# Patient Record
Sex: Male | Born: 1970 | Race: White | Hispanic: No | Marital: Married | Smoking: Former smoker
Health system: Southern US, Community
[De-identification: ages and names within clinical notes are randomized; demographics above are authoritative.]

## PROBLEM LIST (undated history)

## (undated) DIAGNOSIS — F419 Anxiety disorder, unspecified: Secondary | ICD-10-CM

## (undated) DIAGNOSIS — K219 Gastro-esophageal reflux disease without esophagitis: Secondary | ICD-10-CM

## (undated) DIAGNOSIS — E119 Type 2 diabetes mellitus without complications: Secondary | ICD-10-CM

## (undated) DIAGNOSIS — I10 Essential (primary) hypertension: Secondary | ICD-10-CM

## (undated) HISTORY — PX: BACK SURGERY: SHX140

## (undated) HISTORY — PX: HIP SURGERY: SHX245

---

## 2001-03-19 ENCOUNTER — Encounter: Payer: Self-pay | Admitting: Emergency Medicine

## 2001-03-19 ENCOUNTER — Emergency Department (HOSPITAL_COMMUNITY): Admission: EM | Admit: 2001-03-19 | Discharge: 2001-03-19 | Payer: Self-pay | Admitting: Emergency Medicine

## 2002-09-16 ENCOUNTER — Emergency Department (HOSPITAL_COMMUNITY): Admission: EM | Admit: 2002-09-16 | Discharge: 2002-09-16 | Payer: Self-pay | Admitting: Emergency Medicine

## 2003-09-20 ENCOUNTER — Emergency Department (HOSPITAL_COMMUNITY): Admission: EM | Admit: 2003-09-20 | Discharge: 2003-09-21 | Payer: Self-pay | Admitting: *Deleted

## 2007-01-05 ENCOUNTER — Ambulatory Visit (HOSPITAL_COMMUNITY): Admission: RE | Admit: 2007-01-05 | Discharge: 2007-01-05 | Payer: Self-pay | Admitting: Neurosurgery

## 2008-01-09 ENCOUNTER — Emergency Department (HOSPITAL_COMMUNITY): Admission: EM | Admit: 2008-01-09 | Discharge: 2008-01-09 | Payer: Self-pay | Admitting: Emergency Medicine

## 2008-03-20 ENCOUNTER — Emergency Department (HOSPITAL_COMMUNITY): Admission: EM | Admit: 2008-03-20 | Discharge: 2008-03-20 | Payer: Self-pay | Admitting: Emergency Medicine

## 2009-06-04 ENCOUNTER — Emergency Department (HOSPITAL_COMMUNITY): Admission: EM | Admit: 2009-06-04 | Discharge: 2009-06-04 | Payer: Self-pay | Admitting: Emergency Medicine

## 2010-06-11 LAB — BASIC METABOLIC PANEL
BUN: 6 mg/dL (ref 6–23)
CO2: 21 mEq/L (ref 19–32)
Calcium: 9.1 mg/dL (ref 8.4–10.5)
Chloride: 108 mEq/L (ref 96–112)
Creatinine, Ser: 0.82 mg/dL (ref 0.4–1.5)
GFR calc Af Amer: >60 mL/min (ref >60)
Potassium: 3.8 mEq/L (ref 3.5–5.1)
Sodium: 139 mEq/L (ref 135–145)

## 2010-06-11 LAB — CBC
Hemoglobin: 17.9 g/dL — ABNORMAL HIGH (ref 13.0–17.0)
MCHC: 34.3 g/dL (ref 30.0–36.0)
MCV: 98.2 fL (ref 78.0–100.0)
Platelets: 155 10*3/uL (ref 150–400)
WBC: 8.9 10*3/uL (ref 4.0–10.5)

## 2010-06-11 LAB — DIFFERENTIAL
Eosinophils Absolute: 0.2 10*3/uL (ref 0.0–0.7)
Eosinophils Relative: 2 % (ref 0–5)
Monocytes Relative: 5 % (ref 3–12)

## 2010-06-11 LAB — POCT CARDIAC MARKERS: Troponin i, poc: <0.05 ng/mL (ref 0.00–0.09)

## 2010-11-27 LAB — COMPREHENSIVE METABOLIC PANEL
AST: 34
Albumin: 4.4
BUN: 5 — ABNORMAL LOW
CO2: 25
Calcium: 9.2
Creatinine, Ser: 0.88
Glucose, Bld: 115 — ABNORMAL HIGH

## 2010-11-27 LAB — CBC
MCHC: 35.2
MCV: 98.5
Platelets: 161
RBC: 5.07
RDW: 13.2
WBC: 11.9 — ABNORMAL HIGH

## 2010-11-27 LAB — DIFFERENTIAL
Basophils Absolute: 0
Basophils Relative: 0
Eosinophils Absolute: 0.2
Eosinophils Relative: 1
Lymphs Abs: 2.2
Neutro Abs: 8.6 — ABNORMAL HIGH

## 2010-11-27 LAB — URINALYSIS, ROUTINE W REFLEX MICROSCOPIC
Glucose, UA: 250 — AB
Nitrite: NEGATIVE
Protein, ur: 30 — AB
Specific Gravity, Urine: 1.025

## 2010-11-27 LAB — ETHANOL: Alcohol, Ethyl (B): <5

## 2010-11-27 LAB — URINE MICROSCOPIC-ADD ON

## 2014-02-25 ENCOUNTER — Emergency Department (HOSPITAL_COMMUNITY)
Admission: EM | Admit: 2014-02-25 | Discharge: 2014-02-25 | Discharge status: Against Medical Advice | Payer: Self-pay | Attending: Emergency Medicine | Admitting: Emergency Medicine

## 2014-02-25 ENCOUNTER — Emergency Department (HOSPITAL_COMMUNITY): Payer: Self-pay

## 2014-02-25 ENCOUNTER — Encounter (HOSPITAL_COMMUNITY): Payer: Self-pay

## 2014-02-25 DIAGNOSIS — F419 Anxiety disorder, unspecified: Secondary | ICD-10-CM

## 2014-02-25 DIAGNOSIS — I10 Essential (primary) hypertension: Secondary | ICD-10-CM

## 2014-02-25 DIAGNOSIS — R000 Tachycardia, unspecified: Secondary | ICD-10-CM | POA: Insufficient documentation

## 2014-02-25 DIAGNOSIS — R739 Hyperglycemia, unspecified: Secondary | ICD-10-CM

## 2014-02-25 DIAGNOSIS — Z720 Tobacco use: Secondary | ICD-10-CM | POA: Insufficient documentation

## 2014-02-25 DIAGNOSIS — Z79899 Other long term (current) drug therapy: Secondary | ICD-10-CM | POA: Insufficient documentation

## 2014-02-25 LAB — D-DIMER, QUANTITATIVE (NOT AT ARMC)

## 2014-02-25 LAB — CBC WITH DIFFERENTIAL/PLATELET
BASOS ABS: 0.1 10*3/uL (ref 0.0–0.1)
BASOS PCT: 1 % (ref 0–1)
EOS ABS: 0.3 10*3/uL (ref 0.0–0.7)
EOS PCT: 5 % (ref 0–5)
HCT: 46.5 % (ref 39.0–52.0)
Hemoglobin: 16.9 g/dL (ref 13.0–17.0)
Lymphocytes Relative: 25 % (ref 12–46)
Lymphs Abs: 1.6 10*3/uL (ref 0.7–4.0)
MCH: 35.7 pg — AB (ref 26.0–34.0)
MCHC: 36.3 g/dL — AB (ref 30.0–36.0)
MCV: 98.1 fL (ref 78.0–100.0)
Monocytes Absolute: 0.5 10*3/uL (ref 0.1–1.0)
Monocytes Relative: 7 % (ref 3–12)
NEUTROS ABS: 3.9 10*3/uL (ref 1.7–7.7)
Neutrophils Relative %: 62 % (ref 43–77)
PLATELETS: 82 10*3/uL — AB (ref 150–400)
RBC: 4.74 MIL/uL (ref 4.22–5.81)
RDW: 12.1 % (ref 11.5–15.5)
WBC: 6.3 10*3/uL (ref 4.0–10.5)

## 2014-02-25 LAB — URINALYSIS, ROUTINE W REFLEX MICROSCOPIC
Bilirubin Urine: NEGATIVE
Glucose, UA: 500 mg/dL — AB
Hgb urine dipstick: NEGATIVE
KETONES UR: NEGATIVE mg/dL
Leukocytes, UA: NEGATIVE
Nitrite: NEGATIVE
PROTEIN: NEGATIVE mg/dL
Specific Gravity, Urine: 1.02 (ref 1.005–1.030)
Urobilinogen, UA: 1 mg/dL (ref 0.0–1.0)
pH: 6.5 (ref 5.0–8.0)

## 2014-02-25 LAB — COMPREHENSIVE METABOLIC PANEL
ALBUMIN: 3.9 g/dL (ref 3.5–5.2)
ALK PHOS: 136 U/L — AB (ref 39–117)
ALT: 80 U/L — ABNORMAL HIGH (ref 0–53)
AST: 117 U/L — ABNORMAL HIGH (ref 0–37)
Anion gap: 8 (ref 5–15)
BILIRUBIN TOTAL: 1.1 mg/dL (ref 0.3–1.2)
BUN: 6 mg/dL (ref 6–23)
CO2: 23 mmol/L (ref 19–32)
Calcium: 8.6 mg/dL (ref 8.4–10.5)
Chloride: 103 mEq/L (ref 96–112)
Creatinine, Ser: 0.65 mg/dL (ref 0.50–1.35)
GFR calc Af Amer: >90 mL/min (ref >90)
GFR calc non Af Amer: >90 mL/min (ref >90)
Glucose, Bld: 318 mg/dL — ABNORMAL HIGH (ref 70–99)
POTASSIUM: 3.7 mmol/L (ref 3.5–5.1)
SODIUM: 134 mmol/L — AB (ref 135–145)
TOTAL PROTEIN: 7.1 g/dL (ref 6.0–8.3)

## 2014-02-25 LAB — RAPID URINE DRUG SCREEN, HOSP PERFORMED
Amphetamines: NOT DETECTED
BENZODIAZEPINES: NOT DETECTED
Barbiturates: NOT DETECTED
COCAINE: NOT DETECTED
Opiates: NOT DETECTED
Tetrahydrocannabinol: NOT DETECTED

## 2014-02-25 LAB — TROPONIN I
Troponin I: <0.03 ng/mL (ref <0.031)
Troponin I: <0.03 ng/mL (ref <0.031)

## 2014-02-25 LAB — CBG MONITORING, ED: GLUCOSE-CAPILLARY: 202 mg/dL — AB (ref 70–99)

## 2014-02-25 LAB — ETHANOL: Alcohol, Ethyl (B): <5 mg/dL (ref 0–9)

## 2014-02-25 MED ORDER — SODIUM CHLORIDE 0.9 % IV BOLUS (SEPSIS)
1000.0000 mL | Freq: Once | INTRAVENOUS | Status: AC
  Administered 2014-02-25: 1000 mL via INTRAVENOUS

## 2014-02-25 MED ORDER — ALPRAZOLAM 0.25 MG PO TABS: 0.2500 mg | ORAL_TABLET | Freq: Every evening | ORAL | Status: DC | PRN

## 2014-02-25 MED ORDER — HYDROCHLOROTHIAZIDE 25 MG PO TABS: 25.0000 mg | ORAL_TABLET | Freq: Every day | ORAL | Status: DC

## 2014-02-25 MED ORDER — LORAZEPAM 2 MG/ML IJ SOLN
1.0000 mg | Freq: Once | INTRAMUSCULAR | Status: AC
  Administered 2014-02-25: 1 mg via INTRAVENOUS
  Filled 2014-02-25: qty 1

## 2014-02-25 NOTE — ED Notes (Addendum)
Pt reports approx 1 hour ago started feeling very shakey, heart racing, and left arm feeling numb.  Reports felt normal when he woke up.  Says had similar episode several years ago and was told was an anxiety attack.  Denies feeling anxious or stressed prior to this episode.  Pt reports took a bayer aspirin pta.

## 2014-02-25 NOTE — ED Provider Notes (Signed)
CSN: [id number]     Arrival date & time 02/25/14  1054 History   This chart was scribed for Glynn Octave, MD by Ronney Lion, ED Scribe. This patient was seen in room [facility]10/[facility] and the patient's care was started at 11:07 AM.    Chief Complaint  Patient presents with  . Anxiety    The history is provided by the patient. No language interpreter was used.    HPI Comments: Christopher Hampton is a 44 y.o. male who presents to the Emergency Department complaining of gradually improving dizziness and left upper arm numbness that began 1.5 hours ago. Patient woke up normal, took a shower, and smoked a cigarette. He then felt dizzy, shaky, tachycardic, and had left upper arm numbness, prompting him to lie down. Patient had similar symptoms 4-5 years ago, which he was told were due to an anxiety attack, but he denies any recent anxiety triggers or stressors. He has no medications for anxiety and felt okay since. Patient denies any other daily medication, including hypertension medication. Patient reports he drank some alcohol yesterday, although he drinks every day; he denies any EtOH withdrawal symptoms. He also denies headache, chest pain, back pain, pedal edema, leg pain, SOB. Patient has NKDA.   History reviewed. No pertinent past medical history. History reviewed. No pertinent past surgical history. No family history on file. History  Substance Use Topics  . Smoking status: Current Every Day Smoker  . Smokeless tobacco: Not on file  . Alcohol Use: Yes     Comment: daily    Review of Systems  A complete 10 system review of systems was obtained and all systems are negative except as noted in the HPI and PMH.    Allergies  Review of patient's allergies indicates no known allergies.  Home Medications   Prior to Admission medications   Medication Sig Start Date End Date Taking? Authorizing Provider  ALPRAZolam (XANAX) 0.25 MG tablet Take 1 tablet (0.25 mg total) by mouth at bedtime as needed  for anxiety. 02/25/14   Glynn Octave, MD  hydrochlorothiazide (HYDRODIURIL) 25 MG tablet Take 1 tablet (25 mg total) by mouth daily. 02/25/14   Glynn Octave, MD   BP 153/102 mmHg  Pulse 97  Temp(Src) 99.6 F (37.6 C) (Oral)  Resp 15  Ht  (1.778 m)  Wt 190 lb (86.183 kg)  BMI 27.26 kg/m2  SpO2 99% Physical Exam  Constitutional: He is oriented to person, place, and time. He appears well-developed and well-nourished. No distress.  Appears anxious and tremulous.  HENT:  Head: Normocephalic and atraumatic.  Mouth/Throat: Oropharynx is clear and moist. No oropharyngeal exudate.  Eyes: Conjunctivae and EOM are normal. Pupils are equal, round, and reactive to light.  Neck: Normal range of motion. Neck supple.  No meningismus.  Cardiovascular: Normal rate, regular rhythm, normal heart sounds and intact distal pulses.   No murmur heard. Tachycardic.   Pulmonary/Chest: Effort normal and breath sounds normal. No respiratory distress.  Abdominal: Soft. There is no tenderness. There is no rebound and no guarding.  Musculoskeletal: Normal range of motion. He exhibits no edema or tenderness.  Neurological: He is alert and oriented to person, place, and time. No cranial nerve deficit. He exhibits normal muscle tone. Coordination normal.  No ataxia on finger to nose bilaterally. No pronator drift. 5/5 strength throughout. CN 2-12 intact. Negative Romberg. Equal grip strength. Sensation intact. Gait is normal. No appreciable sensory deficits.   Skin: Skin is warm.  Psychiatric: He has  a normal mood and affect. His behavior is normal.  Nursing note and vitals reviewed.   ED Course  Procedures (including critical care time)  DIAGNOSTIC STUDIES: Oxygen Saturation is 100% on room air, normal by my interpretation.    COORDINATION OF CARE: 11:10 AM - Discussed treatment plan with pt at bedside which includes anti-anxiety medication and CXR and pt agreed to plan.   Labs Review Labs Reviewed   CBC WITH DIFFERENTIAL - Abnormal; Notable for the following:    MCH 35.7 (*)    MCHC 36.3 (*)    Platelets 82 (*)    All other components within normal limits  COMPREHENSIVE METABOLIC PANEL - Abnormal; Notable for the following:    Sodium 134 (*)    Glucose, Bld 318 (*)    AST 117 (*)    ALT 80 (*)    Alkaline Phosphatase 136 (*)    All other components within normal limits  URINALYSIS, ROUTINE W REFLEX MICROSCOPIC - Abnormal; Notable for the following:    Glucose, UA 500 (*)    All other components within normal limits  CBG MONITORING, ED - Abnormal; Notable for the following:    Glucose-Capillary 202 (*)    All other components within normal limits  TROPONIN I  URINE RAPID DRUG SCREEN (HOSP PERFORMED)  D-DIMER, QUANTITATIVE  ETHANOL  TROPONIN I    Imaging Review Dg Chest 2 View  02/25/2014   CLINICAL DATA:  Heart racing, left arm numbness starting 1 hr ago.  EXAM: CHEST  2 VIEW  COMPARISON:  None.  FINDINGS: The heart size and mediastinal contours are within normal limits. There is no focal infiltrate, pulmonary edema, or pleural effusion. The visualized skeletal structures are unremarkable.  IMPRESSION: No active cardiopulmonary disease.   Electronically Signed   By: Sherian Rein M.D.   On: 02/25/2014 11:58     EKG Interpretation   Date/Time:  Friday February 25 2014 14:57:42 EST Ventricular Rate:  93 PR Interval:  166 QRS Duration: 74 QT Interval:  332 QTC Calculation: 413 R Axis:   36 Text Interpretation:  Sinus rhythm Left atrial enlargement No significant  change was found Confirmed by Manus Gunning  MD, Phoebe Marter [phone]) on 02/25/2014  3:06:27 PM      MDM   Final diagnoses:  Anxiety  Essential hypertension  Hyperglycemia   Episode of palpitations, anxiety, heart racing, left arm feeling numb, anxious and tremulous. Diagnosed with anxiety 5 years ago no further episodes until today. Denies chest pain or shortness of breath.  Patient hypertensive, tremulous and  tachycardic and anxious appearing.  Denies taking any daily medications. Sinus tachycardia on EKG. T wve inversion lead 3.  NO chest pain.  Denies illicit drug use  Hyperglycemia without DKA. No history of diabetes. Ativan given for anxiety symptoms. Rate improved to 90s. Blood pressure remains elevated 160/100.  Patient states he drinks 4-5 beers daily. I questioned him whether this could be related to alcohol withdrawal. He states his last drink was last night around 10 PM his alcohol level is undetectable. He states he's never had this problem before with drinking.  He denies any chest pain or shortness of breath. He is less tremulous.  He needs to establish care with PCP regarding his elevated blood pressure and blood sugar. He is informed he could be diabetic. No evidence of DKA.  Will start low dose benzo for anxiety symptoms and possible alcohol withdrawal. As well as HCTZ for BP.  D/w patient his EKG changes and  numbness he had in his arm earlier.  Troponin negative x 2, d-dimer negative and he is feeling better.  Seems atypical for ACS. He declines observation admission.  D/w patient he could be at risk for heart attack or alcohol withdrawal symptoms including DTs.  He appears to have capacity to make his own medical decisions, accepts these risks and will leave against medical advice.  I personally performed the services described in this documentation, which was scribed in my presence. The recorded information has been reviewed and is accurate.    Glynn Octave, MD 02/25/14 2017

## 2014-02-25 NOTE — Discharge Instructions (Signed)
Hypertension As we discussed her anxiety and palpitations may be related to alcohol withdrawal. Follow up with your doctor. Establish care with a primary care physician. Your blood sugar and blood pressure elevated in both need follow-up. Return to the ED develop chest pain, shortness of breath, any other concerns. Hypertension, commonly called high blood pressure, is when the force of blood pumping through your arteries is too strong. Your arteries are the blood vessels that carry blood from your heart throughout your body. A blood pressure reading consists of a higher number over a lower number, such as 110/72. The higher number (systolic) is the pressure inside your arteries when your heart pumps. The lower number (diastolic) is the pressure inside your arteries when your heart relaxes. Ideally you want your blood pressure below 120/80. Hypertension forces your heart to work harder to pump blood. Your arteries may become narrow or stiff. Having hypertension puts you at risk for heart disease, stroke, and other problems.  RISK FACTORS Some risk factors for high blood pressure are controllable. Others are not.  Risk factors you cannot control include:   Race. You may be at higher risk if you are African American.  Age. Risk increases with age.  Gender. Men are at higher risk than women before age 29 years. After age 45, women are at higher risk than men. Risk factors you can control include:  Not getting enough exercise or physical activity.  Being overweight.  Getting too much fat, sugar, calories, or salt in your diet.  Drinking too much alcohol. SIGNS AND SYMPTOMS Hypertension does not usually cause signs or symptoms. Extremely high blood pressure (hypertensive crisis) may cause headache, anxiety, shortness of breath, and nosebleed. DIAGNOSIS  To check if you have hypertension, your health care provider will measure your blood pressure while you are seated, with your arm held at the  level of your heart. It should be measured at least twice using the same arm. Certain conditions can cause a difference in blood pressure between your right and left arms. A blood pressure reading that is higher than normal on one occasion does not mean that you need treatment. If one blood pressure reading is high, ask your health care provider about having it checked again. TREATMENT  Treating high blood pressure includes making lifestyle changes and possibly taking medicine. Living a healthy lifestyle can help lower high blood pressure. You may need to change some of your habits. Lifestyle changes may include:  Following the DASH diet. This diet is high in fruits, vegetables, and whole grains. It is low in salt, red meat, and added sugars.  Getting at least 2 hours of brisk physical activity every week.  Losing weight if necessary.  Not smoking.  Limiting alcoholic beverages.  Learning ways to reduce stress. If lifestyle changes are not enough to get your blood pressure under control, your health care provider may prescribe medicine. You may need to take more than one. Work closely with your health care provider to understand the risks and benefits. HOME CARE INSTRUCTIONS  Have your blood pressure rechecked as directed by your health care provider.   Take medicines only as directed by your health care provider. Follow the directions carefully. Blood pressure medicines must be taken as prescribed. The medicine does not work as well when you skip doses. Skipping doses also puts you at risk for problems.   Do not smoke.   Monitor your blood pressure at home as directed by your health care provider. SEEK MEDICAL  CARE IF:   You think you are having a reaction to medicines taken.  You have recurrent headaches or feel dizzy.  You have swelling in your ankles.  You have trouble with your vision. SEEK IMMEDIATE MEDICAL CARE IF:  You develop a severe headache or confusion.  You  have unusual weakness, numbness, or feel faint.  You have severe chest or abdominal pain.  You vomit repeatedly.  You have trouble breathing. MAKE SURE YOU:   Understand these instructions.  Will watch your condition.  Will get help right away if you are not doing well or get worse. Document Released: 02/11/2005 Document Revised: 06/28/2013 Document Reviewed: 12/04/2012 [facility] Patient Information 2015 [place], [place]. This information is not intended to replace advice given to you by your health care provider. Make sure you discuss any questions you have with your health care provider.   Emergency Department Resource Guide 1) Find a Doctor and Pay Out of Pocket Although you won't have to find out who is covered by your insurance plan, it is a good idea to ask around and get recommendations. You will then need to call the office and see if the doctor you have chosen will accept you as a new patient and what types of options they offer for patients who are self-pay. Some doctors offer discounts or will set up payment plans for their patients who do not have insurance, but you will need to ask so you aren't surprised when you get to your appointment.  2) Contact Your Local Health Department Not all health departments have doctors that can see patients for sick visits, but many do, so it is worth a call to see if yours does. If you don't know where your local health department is, you can check in your phone book. The CDC also has a tool to help you locate your state's health department, and many state websites also have listings of all of their local health departments.  3) Find a Walk-in Clinic If your illness is not likely to be very severe or complicated, you may want to try a walk in clinic. These are popping up all over the country in pharmacies, drugstores, and shopping centers. They're usually staffed by nurse practitioners or physician assistants that have been trained to treat  common illnesses and complaints. They're usually fairly quick and inexpensive. However, if you have serious medical issues or chronic medical problems, these are probably not your best option.  No Primary Care Doctor: - Call Health Connect at  [phone] - they can help you locate a primary care doctor that  accepts your insurance, provides certain services, etc. - Physician Referral Service- [phone]  Chronic Pain Problems: Organization         Address  Phone   Notes  [facility]  [phone] Patients need to be referred by their primary care doctor.   Medication Assistance: Organization         Address  Phone   Notes  [facility] Medication [facility] [address] [place] [place], [place] [address] [phone] --Must be a resident of [facility] -- Must have NO insurance coverage whatsoever (no Medicaid/ Medicare, etc.) -- The pt. MUST have a primary care doctor that directs their care regularly and follows them in the community   MedAssist  [phone]   [employer]  [phone]    Agencies that provide inexpensive medical care: Organization  Address  Phone   Notes  [facility]  [phone]   [facility]    [phone]   [facility] [address] [place], [place] [address] [phone]) [phone]   [facility] [address], [place] [phone]) [phone]   [facility]anned Parenthood    [phone]   [facility]    [phone](336) [phone]   [facility]  [address], [place] Phone:  [phone](336) [phone], Fax:  [phone] Hours of Operation:  9 am - 6 pm, M-F.  Also accepts Medicaid/Medicare and self-pay.  [facility]  [address], [place] Phone: [phone](336) [phone], Fax: [phone](336) [phone]. Hours of Operation:  8:30 am - 5:30 pm, M-F.  Also accepts Medicaid and self-pay.  [facility] [place] [address], [place] Phone: [phone]) [phone]   [facility] [address], [place], [place] [phone], Ext. [phone] Mondays & Thursdays: 7-9 AM.  First 15 patients are seen on a first come, first serve basis.    Medicaid-accepting [facility] Providers:  Organization         Address  Phone   Notes  [facility] [address], [place] [phone] Also accepts self-pay patients.  [facility] [address], [place]  [phone]) [phone]   [facility] [address], [place] [phone]   [facility] [address] [place] [address], [place] [phone]) [phone]   Renaye RakersVeita Bland [address], [place]   [phone]) [phone] Only accepts [place] Access [place] patients after they have their name applied to their card.   Self-Pay (no insurance) in [facility]:  Organization         Address  Phone   Notes  Sickle Cell Patients, [facility] [address], [place] [phone]) [phone]   [facility] [address], [place] [phone]) [phone]   [facility]  [address], [place] [phone]) [phone]   [facility]/Dr. Osei-Bonsu  [address] [place] [address], [place] or [phone], [place] [phone] Phone number for both [place] and [place] locations is the same.  [facility] [address], [place] [phone]) [phone]   [facility]  [address] [place] [address], [place] or [address] [phone]   [facility] [address], [place] [phone]) [phone], phone; [phone]) [phone], fax Sees patients 1st and 3rd Saturday of every month.  Must not qualify for public or private insurance (i.e. Medicaid, Medicare, [place] Health Choice, Veterans' Benefits)  Household income should be no more than 200% of the  poverty level The clinic cannot treat you if you are pregnant or think you are pregnant  Sexually transmitted diseases are not treated at the clinic.    Dental Care: Organization         Address  Phone  Notes  [facility] of [facility] [address], [place] [phone] Accepts children up to age 43 who are enrolled in [place] or [place] Health Choice; pregnant women with a Medicaid card; and children who have applied for Medicaid or [place] Health Choice, but were declined, whose parents can pay a reduced fee at time  of service.  [facility]  [address], [place] [phone] Accepts children up to age 75 who are enrolled in [place] or [place] Health Choice; pregnant women with a Medicaid card; and children who have applied for Medicaid or [place] Health Choice, but were declined, whose parents can pay a reduced fee at time of service.  [facility]  [address] [place], [place] [phone] Patients are seen by appointment only. Walk-ins are not accepted. [facility] will see patients 74 years of age and older. Monday - Tuesday (8am-5pm) Most Wednesdays (8:30-5pm) $30 per visit, cash only  [facility] Adult Dental Access PROGRAM  [address], [facility] [phone] Patients are seen by appointment only. Walk-ins are not accepted. [facility] will see patients 72 years of age and older. One Wednesday Evening (Monthly: Volunteer Based).  $30 per visit, cash only  [employer]  [phone] for adults; Children under age 51, call Graduate Pediatric Dentistry at [phone]. Children aged 105-14, please call [phone] to request a pediatric application.  Dental services are provided in all areas of dental care including fillings, crowns and bridges, complete and partial dentures, implants, gum treatment, root canals, and extractions.  Preventive care is also provided. Treatment is provided to both adults and children. Patients are selected via a lottery and there is often a waiting list.   [facility] [address], [place]  [phone] [email or web address]   [facility] [address] [place], [place] [phone], Ext. [phone] Second and Fourth Thursday of each month, opens at 6:30 AM; Clinic ends at 9 AM.  Patients are seen on a first-come first-served basis, and a limited number are seen during each clinic.   [facility]  [address] [place], [place] [phone]   Eligibility Requirements You must have lived in [place], [place], or [place] counties for at least the last three months.   You cannot be eligible for state or federal sponsored [place], including [employer], [place], or [employer].   You generally cannot be eligible for healthcare insurance through your employer.    How to apply: Eligibility screenings are held every Tuesday and Wednesday afternoon from 1:00 pm until 4:00 pm. You do not need an appointment for the interview!  [facility] [address] [place], [place] [phone]   [facility] Health Department  [phone]   [facility] Health Department  [phone]   [facility] Health Department  [phone]    Behavioral Health Resources in the Community: Intensive Outpatient Programs Organization         Address  Phone  Notes  [facility] [address] [place], [place] [phone]   [facility] [address], [place], [place] [phone]   [facility] [address] [place], [place]  [phone]   [facility] [address]  [place], [place] [phone] or [phone]   Substance Abuse Resources Organization         Address  Phone  Notes  Al[facility] Drug Services  [phone]   Addiction  [facility]  [phone]   [facility] [place]  [phone]   Floydene Flock  [phone]   Residential & Outpatient Substance Abuse Program  [phone]   Psychological Services Organization         Address  Phone  Notes  [employer] [facility]  [phone]   [facility]  [phone]   [facility] [address] [place] [phone] or [phone]    Mobile Crisis Teams Organization         Address  Phone  Notes  [facility], Mobile Crisis Care Unit  [phone]   [facility]  [address] [place], [place] [phone]   Doristine Locks [address] [place] [phone]    Self-Help/Support Groups Organization         Address  Phone             Notes  [facility] [place] - variety of support groups  336- I7437963 Call for more information  Narcotics Anonymous (NA), [facility] [address], [employer] [place]  2 meetings at this location   Statistician         Address  Phone  Notes  [facility] [address],    [place]  [phone]   [facility]  [address], [place] [address], [place], [place] [phone]   [facility] [address] [place], [place] [phone] Admissions: 8am-3pm M-F  [facility] [address]    [place], [place] [phone]   [facility] [address] [place], [place], [place] [phone]   [facility] [address]  [place], [place] [phone]   [facility] - Intensive Outpatient [address], [place], [place] [phone]   [facility] ([facility] [address] [place]  [place], [place] [phone] or [phone]   [facility] ([facility]) [address], [place], [place] [phone] Accepts Medicaid  [facility] [address],  [place]  [phone] Substance Abuse/Addiction Treatment   [facility] Organization         Address  Phone  Notes  [facility]  [phone]   Angie Fava, PhD [address] [place], [place]   [phone] or [phone]   [facility]   [address] [place], [place] [phone]   [facility] [address] [place], [place] [phone] Insurance/Medicaid/sponsorship through [facility] and Families [address]                                    [place], [place] [phone] Therapy/tele-psych/case  [facility] [address], [place] [phone]    Dr. Lolly Mustache  [phone]   [facility] [place]  [employer] [facility] Dept. 1) [address], [place] 2) [address] [place] [address], [place] 3)  [address], [place] [phone]  [phone]   [facility] Child Abuse Hotline 8645065601 or [phone] (After Hours)

## 2018-01-21 ENCOUNTER — Other Ambulatory Visit (HOSPITAL_COMMUNITY): Payer: Self-pay | Admitting: Family Medicine

## 2018-01-21 DIAGNOSIS — R0989 Other specified symptoms and signs involving the circulatory and respiratory systems: Secondary | ICD-10-CM

## 2018-01-26 ENCOUNTER — Ambulatory Visit (HOSPITAL_COMMUNITY)
Admission: RE | Admit: 2018-01-26 | Discharge: 2018-01-26 | Disposition: A | Discharge status: Home / Self Care | Payer: PRIVATE HEALTH INSURANCE | Source: Ambulatory Visit | Attending: Family Medicine | Admitting: Family Medicine

## 2018-01-26 DIAGNOSIS — I6523 Occlusion and stenosis of bilateral carotid arteries: Secondary | ICD-10-CM | POA: Diagnosis not present

## 2018-01-26 DIAGNOSIS — R0989 Other specified symptoms and signs involving the circulatory and respiratory systems: Secondary | ICD-10-CM

## 2018-01-28 ENCOUNTER — Encounter (HOSPITAL_COMMUNITY): Payer: Self-pay | Admitting: Emergency Medicine

## 2018-01-28 ENCOUNTER — Emergency Department (HOSPITAL_COMMUNITY): Payer: PRIVATE HEALTH INSURANCE

## 2018-01-28 ENCOUNTER — Other Ambulatory Visit: Payer: Self-pay

## 2018-01-28 ENCOUNTER — Emergency Department (HOSPITAL_COMMUNITY)
Admission: EM | Admit: 2018-01-28 | Discharge: 2018-01-28 | Disposition: A | Discharge status: Home / Self Care | Payer: PRIVATE HEALTH INSURANCE | Attending: Emergency Medicine | Admitting: Emergency Medicine

## 2018-01-28 DIAGNOSIS — Z79899 Other long term (current) drug therapy: Secondary | ICD-10-CM | POA: Insufficient documentation

## 2018-01-28 DIAGNOSIS — R1011 Right upper quadrant pain: Secondary | ICD-10-CM | POA: Insufficient documentation

## 2018-01-28 DIAGNOSIS — E119 Type 2 diabetes mellitus without complications: Secondary | ICD-10-CM | POA: Insufficient documentation

## 2018-01-28 DIAGNOSIS — Z7984 Long term (current) use of oral hypoglycemic drugs: Secondary | ICD-10-CM | POA: Insufficient documentation

## 2018-01-28 DIAGNOSIS — F17220 Nicotine dependence, chewing tobacco, uncomplicated: Secondary | ICD-10-CM | POA: Insufficient documentation

## 2018-01-28 DIAGNOSIS — Z7982 Long term (current) use of aspirin: Secondary | ICD-10-CM | POA: Insufficient documentation

## 2018-01-28 HISTORY — DX: Type 2 diabetes mellitus without complications: E11.9

## 2018-01-28 LAB — LIPASE, BLOOD: Lipase: 28 U/L (ref 11–51)

## 2018-01-28 LAB — COMPREHENSIVE METABOLIC PANEL
ALBUMIN: 4.4 g/dL (ref 3.5–5.0)
ALT: 69 U/L — ABNORMAL HIGH (ref 0–44)
AST: 45 U/L — AB (ref 15–41)
Alkaline Phosphatase: 72 U/L (ref 38–126)
Anion gap: 9 (ref 5–15)
BUN: 13 mg/dL (ref 6–20)
CO2: 24 mmol/L (ref 22–32)
Calcium: 9.5 mg/dL (ref 8.9–10.3)
Chloride: 101 mmol/L (ref 98–111)
Creatinine, Ser: 0.9 mg/dL (ref 0.61–1.24)
GFR calc Af Amer: >60 mL/min (ref >60)
Glucose, Bld: 176 mg/dL — ABNORMAL HIGH (ref 70–99)
POTASSIUM: 4.2 mmol/L (ref 3.5–5.1)
Sodium: 134 mmol/L — ABNORMAL LOW (ref 135–145)
TOTAL PROTEIN: 7.7 g/dL (ref 6.5–8.1)
Total Bilirubin: 1.2 mg/dL (ref 0.3–1.2)

## 2018-01-28 LAB — CBC WITH DIFFERENTIAL/PLATELET
Abs Immature Granulocytes: 0.03 10*3/uL (ref 0.00–0.07)
Basophils Absolute: 0.1 10*3/uL (ref 0.0–0.1)
Basophils Relative: 1 %
EOS ABS: 0.2 10*3/uL (ref 0.0–0.5)
EOS PCT: 3 %
HCT: 43.9 % (ref 39.0–52.0)
Hemoglobin: 15.5 g/dL (ref 13.0–17.0)
IMMATURE GRANULOCYTES: 0 %
Lymphocytes Relative: 21 %
Lymphs Abs: 1.7 10*3/uL (ref 0.7–4.0)
MCH: 32.5 pg (ref 26.0–34.0)
MCHC: 35.3 g/dL (ref 30.0–36.0)
MCV: 92 fL (ref 80.0–100.0)
Monocytes Absolute: 0.8 10*3/uL (ref 0.1–1.0)
Monocytes Relative: 10 %
NEUTROS PCT: 65 %
NRBC: 0 % (ref 0.0–0.2)
Neutro Abs: 5.2 10*3/uL (ref 1.7–7.7)
Platelets: 144 10*3/uL — ABNORMAL LOW (ref 150–400)
RBC: 4.77 MIL/uL (ref 4.22–5.81)
RDW: 11.9 % (ref 11.5–15.5)
WBC: 8 10*3/uL (ref 4.0–10.5)

## 2018-01-28 LAB — URINALYSIS, ROUTINE W REFLEX MICROSCOPIC
Bilirubin Urine: NEGATIVE
Glucose, UA: 50 mg/dL — AB
Hgb urine dipstick: NEGATIVE
KETONES UR: NEGATIVE mg/dL
Leukocytes, UA: NEGATIVE
NITRITE: NEGATIVE
Protein, ur: NEGATIVE mg/dL
Specific Gravity, Urine: 1.012 (ref 1.005–1.030)
pH: 6 (ref 5.0–8.0)

## 2018-01-28 MED ORDER — ONDANSETRON HCL 4 MG/2ML IJ SOLN
4.0000 mg | Freq: Once | INTRAMUSCULAR | Status: AC
  Administered 2018-01-28: 4 mg via INTRAVENOUS
  Filled 2018-01-28: qty 2

## 2018-01-28 MED ORDER — HYDROCODONE-ACETAMINOPHEN 5-325 MG PO TABS: 1.0000 | ORAL_TABLET | ORAL | 0 refills | Status: DC | PRN

## 2018-01-28 MED ORDER — IOPAMIDOL (ISOVUE-300) INJECTION 61%
100.0000 mL | Freq: Once | INTRAVENOUS | Status: AC | PRN
  Administered 2018-01-28: 100 mL via INTRAVENOUS

## 2018-01-28 MED ORDER — MORPHINE SULFATE (PF) 4 MG/ML IV SOLN
4.0000 mg | Freq: Once | INTRAVENOUS | Status: AC
  Administered 2018-01-28: 4 mg via INTRAVENOUS
  Filled 2018-01-28: qty 1

## 2018-01-28 MED ORDER — ONDANSETRON 4 MG PO TBDP: 4.0000 mg | ORAL_TABLET | Freq: Three times a day (TID) | ORAL | 0 refills | Status: DC | PRN

## 2018-01-28 NOTE — ED Notes (Signed)
Patient transported to Ultrasound 

## 2018-01-28 NOTE — ED Notes (Signed)
Patient ambulated to bathroom and back with no assistance or difficulty.

## 2018-01-28 NOTE — ED Notes (Signed)
Advised patient not to drive after discharge due to narcotic medication administration. Also advised patient not to drive while taking prescription pain medication. Patient verbalized understanding. Discharged with spouse to drive him home.

## 2018-01-28 NOTE — ED Provider Notes (Signed)
[facility] EMERGENCY DEPARTMENT Provider Note   CSN: [id number] Arrival date & time: 01/28/18  1014     History   Chief Complaint Chief Complaint  Patient presents with  . Abdominal Pain    HPI Christopher Hampton is a 47 y.o. male.  The history is provided by the patient and the spouse.  Abdominal Pain   This is a new problem. The current episode started 6 to 12 hours ago (Patient woke at 2 AM this morning with right upper abdominal pain.). The problem occurs constantly. The problem has not changed since onset.The pain is located in the RUQ. The pain is at a severity of 10/10. The pain is severe. Associated symptoms include anorexia and nausea. Pertinent negatives include fever, diarrhea, vomiting, constipation, dysuria, frequency and hematuria. Associated symptoms comments: Patient denies chest pain or shortness of breath.  He endorses pain increases with deep inspiration however.  Pain also radiates into his right mid to lower back region.. The symptoms are aggravated by palpation and deep breathing. Nothing (He has tried antacids including Pepto-Bismol without relief.) relieves the symptoms. His past medical history is significant for GERD. His past medical history does not include PUD, gallstones, ulcerative colitis, Crohn's disease or irritable bowel syndrome.    Past Medical History:  Diagnosis Date  . Diabetes mellitus without complication (HCC)     There are no active problems to display for this patient.   Past Surgical History:  Procedure Laterality Date  . BACK SURGERY          Home Medications    Prior to Admission medications   Medication Sig Start Date End Date Taking? Authorizing Provider  aspirin EC 81 MG tablet Take 81 mg by mouth daily.   Yes [provider]  bismuth subsalicylate (PEPTO BISMOL) 262 MG/15ML suspension Take 30 mLs by mouth every 6 (six) hours as needed.   Yes [provider]  calcium carbonate (TUMS - DOSED IN MG ELEMENTAL  CALCIUM) 500 MG chewable tablet Chew 1 tablet by mouth daily.   Yes [provider]  lisinopril-hydrochlorothiazide (PRINZIDE,ZESTORETIC) 10-12.5 MG tablet Take 1 tablet by mouth daily. 01/17/18  Yes [provider]  metFORMIN (GLUCOPHAGE) 500 MG tablet Take 500 mg by mouth daily. 01/17/18  Yes [provider]  omeprazole (PRILOSEC) 20 MG capsule Take 20 mg by mouth daily. 01/17/18  Yes [provider]  HYDROcodone-acetaminophen (NORCO/VICODIN) 5-325 MG tablet Take 1 tablet by mouth every 4 (four) hours as needed. 01/28/18   Burgess Amor, PA-C  ondansetron (ZOFRAN ODT) 4 MG disintegrating tablet Take 1 tablet (4 mg total) by mouth every 8 (eight) hours as needed for nausea or vomiting. 01/28/18   Burgess Amor, PA-C    Family History History reviewed. No pertinent family history.  Social History Social History   Tobacco Use  . Smoking status: Former Smoker    Last attempt to quit: 01/14/2018    Years since quitting: 0.0  . Smokeless tobacco: Current User    Types: Chew  Substance Use Topics  . Alcohol use: Yes    Comment: daily  . Drug use: No     Allergies   Patient has no known allergies.   Review of Systems Review of Systems  Constitutional: Negative for chills and fever.  Gastrointestinal: Positive for abdominal pain, anorexia and nausea. Negative for constipation, diarrhea and vomiting.  Genitourinary: Negative for dysuria, frequency and hematuria.     Physical Exam Updated Vital Signs BP 108/82 (BP Location: Right Arm)  Pulse 84   Temp 97.9 F (36.6 C) (Oral)   Resp 18   Ht 5\' 10"  (1.778 m)   Wt 97.1 kg   SpO2 97%   BMI 30.71 kg/m   Physical Exam  Constitutional: He appears well-developed and well-nourished.  HENT:  Head: Normocephalic and atraumatic.  Eyes: Conjunctivae are normal.  Neck: Normal range of motion.  Cardiovascular: Normal rate, regular rhythm, normal heart sounds and intact distal pulses.  Pulmonary/Chest:  Effort normal and breath sounds normal. He has no wheezes.  Abdominal: Soft. Bowel sounds are normal. There is tenderness in the right upper quadrant. There is guarding and positive Murphy's sign.  Musculoskeletal: Normal range of motion.  Neurological: He is alert.  Skin: Skin is warm and dry.  Psychiatric: He has a normal mood and affect.  Nursing note and vitals reviewed.    ED Treatments / Results  Labs (all labs ordered are listed, but only abnormal results are displayed) Labs Reviewed  COMPREHENSIVE METABOLIC PANEL - Abnormal; Notable for the following components:      Result Value   Sodium 134 (*)    Glucose, Bld 176 (*)    AST 45 (*)    ALT 69 (*)    All other components within normal limits  CBC WITH DIFFERENTIAL/PLATELET - Abnormal; Notable for the following components:   Platelets 144 (*)    All other components within normal limits  URINALYSIS, ROUTINE W REFLEX MICROSCOPIC - Abnormal; Notable for the following components:   APPearance CLOUDY (*)    Glucose, UA 50 (*)    All other components within normal limits  LIPASE, BLOOD    EKG None  Radiology Ct Abdomen Pelvis W Contrast  Result Date: 01/28/2018 CLINICAL DATA:  Right flank pain with nausea since 0200 hours this morning. EXAM: CT ABDOMEN AND PELVIS WITH CONTRAST TECHNIQUE: Multidetector CT imaging of the abdomen and pelvis was performed using the standard protocol following bolus administration of intravenous contrast. CONTRAST:  100mL ISOVUE-300 IOPAMIDOL (ISOVUE-300) INJECTION 61% COMPARISON:  01/09/2008 FINDINGS: Lower chest: Normal heart size without pericardial effusion. Dependent bibasilar atelectasis. Mitral annular calcifications are present. Hepatobiliary: Steatosis of the liver. Moderate distention of the gallbladder without stones or wall thickening. No pericholecystic fluid. No biliary dilatation. No space-occupying mass of the liver. Pancreas: Normal Spleen: Normal Adrenals/Urinary Tract: Normal  bilateral adrenal glands and symmetric cortical enhancement of the kidneys. There appears be a calcification in the lower pole the left kidney measuring 6 mm. No hydroureteronephrosis. No ureteral calculi. The urinary bladder is physiologically distended and unremarkable. Stomach/Bowel: Stomach is within normal limits. Appendix appears normal. Moderate stool retention within the colon from cecum through distal transverse. No evidence of bowel wall thickening, distention, or inflammatory changes. Vascular/Lymphatic: Moderate aortoiliac and branch vessel atherosclerosis without aneurysm. No lymphadenopathy. Reproductive: Normal size prostate. Seminal vesicles are unremarkable. Other: Small fat containing left inguinal hernia. No free air nor free fluid. Musculoskeletal: Large osteochondroma off the lesser trochanter the left femur without cartilaginous cap identified. This measures up to 7.5 cm and is stable. 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 No acute nor suspicious osseous abnormalities otherwise noted. IMPRESSION: 1. Steatosis of the liver without space-occupying mass. 2. Nonobstructing calculus in the lower pole left kidney. No evidence of hydroureteronephrosis. 3. Aortoiliac atherosclerosis without aneurysm. 4. Stable osteochondroma off the lesser trochanter of the left femur. Electronically Signed   By: Tollie Ethavid  Kwon M.D.   On: 01/28/2018 17:04  US Abdomen Limited Ruq  Result Date: 01/28/2018 CLINICAL DATA:  Onset right upper quadrant pain at 2 a.m. last night. EXAM: ULTRASOUND ABDOMEN LIMITED RIGHT UPPER QUADRANT COMPARISON:  None. FINDINGS: Gallbladder: No gallstones or wall thickening visualized. There is a small amount of sludge in the gallbladder. No sonographic Murphy sign noted by sonographer. Common bile duct: Diameter: 0.4 cm Liver: No focal lesion. Echogenicity is increased consistent with fatty infiltration.  Portal vein is patent on color Doppler imaging with normal direction of blood flow towards the liver. IMPRESSION: Negative for gallstones or evidence of acute cholecystitis. Small volume of sludge in the gallbladder noted. Fatty infiltration of the liver. Electronically Signed   By: Drusilla Kanner M.D.   On: 01/28/2018 12:22    Procedures Procedures (including critical care time)  Medications Ordered in ED Medications  morphine 4 MG/ML injection 4 mg (4 mg Intravenous Given 01/28/18 1111)  ondansetron (ZOFRAN) injection 4 mg (4 mg Intravenous Given 01/28/18 1111)  iopamidol (ISOVUE-300) 61 % injection 100 mL (100 mLs Intravenous Contrast Given 01/28/18 1619)     Initial Impression / Assessment and Plan / ED Course  I have reviewed the triage vital signs and the nursing notes.  Pertinent labs & imaging results that were available during my care of the patient were reviewed by me and considered in my medical decision making (see chart for details).      Discussed labs and ultrasound results with patient.  He still has significant pain with movement now as he just walked to the bathroom, but comfortable at rest.  Ultrasound inconclusive, will proceed to CT scan.  Discussed his elevated LFTs.  He states he does drink daily, but only 1 drink each evening prior to bedtime, wife at bedside confirms.  CT scan completed with no source of sx determined. Gallbladder distended with sludge but no acute cholecystitis. Suspect he has gallbladder dysfunction and may need Hida scan to confirm.  He has f/u with his pcp tomorrow to review results of recent carotid US.  Advised to review with pcp and consider further imaging/possible GI consult.  Final Clinical Impressions(s) / ED Diagnoses   Final diagnoses:  Right upper quadrant abdominal pain    ED Discharge Orders         Ordered    HYDROcodone-acetaminophen (NORCO/VICODIN) 5-325 MG tablet  Every 4 hours PRN     01/28/18 1745    ondansetron  (ZOFRAN ODT) 4 MG disintegrating tablet  Every 8 hours PRN     01/28/18 1745           Burgess Amor, PA-C 01/28/18 2125    Bethann Berkshire, MD 01/29/18 [phone]

## 2018-01-28 NOTE — ED Triage Notes (Signed)
Pt c/o right flank pain with nausea since 0200 this morning, denies v/d/fever

## 2018-01-28 NOTE — Discharge Instructions (Signed)
As discussed, you may need further tests of your gallbladder (such as a Hida scan)to access your pain further.  You do not have  gallstones or inflammation of your gallbladder, but there is some sludge (precursor to stones) in this structure.  It is possible for you to have an improperly functioning gallbladder, given this finding.  Discuss this with your primary provider tomorrow. You may take the hydrocodone prescribed for pain relief.  This will make you drowsy - do not drive within 4 hours of taking this medication.

## 2018-02-12 ENCOUNTER — Encounter: Payer: Self-pay | Admitting: General Surgery

## 2018-02-12 ENCOUNTER — Ambulatory Visit: Payer: PRIVATE HEALTH INSURANCE | Admitting: General Surgery

## 2018-02-12 VITALS — BP 142/84 | HR 89 | Temp 99.5°F | Resp 20 | Wt 218.2 lb

## 2018-02-12 DIAGNOSIS — K828 Other specified diseases of gallbladder: Secondary | ICD-10-CM

## 2018-02-12 NOTE — Patient Instructions (Signed)
Cholelithiasis  Cholelithiasis is also called "gallstones." It is a kind of gallbladder disease. The gallbladder is an organ that stores a liquid (bile) that helps you digest fat. Gallstones may not cause symptoms (may be silent gallstones) until they cause a blockage, and then they can cause pain (gallbladder attack). Follow these instructions at home:  Take over-the-counter and prescription medicines only as told by your doctor.  Stay at a healthy weight.  Eat healthy foods. This includes: ? Eating fewer fatty foods, like fried foods. ? Eating fewer refined carbs (refined carbohydrates). Refined carbs are breads and grains that are highly processed, like white bread and white rice. Instead, choose whole grains like whole-wheat bread and brown rice. ? Eating more fiber. Almonds, fresh fruit, and beans are healthy sources of fiber.  Keep all follow-up visits as told by your doctor. This is important. Contact a doctor if:  You have sudden pain in the upper right side of your belly (abdomen). Pain might spread to your right shoulder or your chest. This may be a sign of a gallbladder attack.  You feel sick to your stomach (are nauseous).  You throw up (vomit).  You have been diagnosed with gallstones that have no symptoms and you get: ? Belly pain. ? Discomfort, burning, or fullness in the upper part of your belly (indigestion). Get help right away if:  You have sudden pain in the upper right side of your belly, and it lasts for more than 2 hours.  You have belly pain that lasts for more than 5 hours.  You have a fever or chills.  You keep feeling sick to your stomach or you keep throwing up.  Your skin or the whites of your eyes turn yellow (jaundice).  You have dark-colored pee (urine).  You have light-colored poop (stool). Summary  Cholelithiasis is also called "gallstones."  The gallbladder is an organ that stores a liquid (bile) that helps you digest fat.  Silent  gallstones are gallstones that do not cause symptoms.  A gallbladder attack may cause sudden pain in the upper right side of your belly. Pain might spread to your right shoulder or your chest. If this happens, contact your doctor.  If you have sudden pain in the upper right side of your belly that lasts for more than 2 hours, get help right away. This information is not intended to replace advice given to you by your health care provider. Make sure you discuss any questions you have with your health care provider. Document Released: 07/31/2007 Document Revised: 10/29/2015 Document Reviewed: 10/29/2015 Elsevier Interactive Patient Education  2019 Else[employer] Inc[employer]   Laparoscopic Cholecystectomy Laparoscopic cholecystectomy is surgery to remove the gallbladder. The gallbladder is a pear-shaped organ that lies beneath the liver on the right side of the body. The gallbladder stores bile, which is a fluid that helps the body to digest fats. Cholecystectomy is often done for inflammation of the gallbladder (cholecystitis). This condition is usually caused by a buildup of gallstones (cholelithiasis) in the gallbladder. Gallstones can block the flow of bile, which can result in inflammation and pain. In severe cases, emergency surgery may be required. This procedure is done though small incisions in your abdomen (laparoscopic surgery). A thin scope with a camera (laparoscope) is inserted through one incision. Thin surgical instruments are inserted through the other incisions. In some cases, a laparoscopic procedure may be turned into a type of surgery that is done through a larger incision (open surgery). Tell a health care provider   about:  Any allergies you have.  All medicines you are taking, including vitamins, herbs, eye drops, creams, and over-the-counter medicines.  Any problems you or family members have had with anesthetic medicines.  Any blood disorders you have.  Any surgeries you have had.   Any medical conditions you have.  Whether you are pregnant or may be pregnant. What are the risks? Generally, this is a safe procedure. However, problems may occur, including:  Infection.  Bleeding.  Allergic reactions to medicines.  Damage to other structures or organs.  A stone remaining in the common bile duct. The common bile duct carries bile from the gallbladder into the small intestine.  A bile leak from the cyst duct that is clipped when your gallbladder is removed. Medicines  Ask your health care provider about: ? Changing or stopping your regular medicines. This is especially important if you are taking diabetes medicines or blood thinners. ? Taking medicines such as aspirin and ibuprofen. These medicines can thin your blood. Do not take these medicines before your procedure if your health care provider instructs you not to.  You may be given antibiotic medicine to help prevent infection. General instructions  Let your health care provider know if you develop a cold or an infection before surgery.  Plan to have someone take you home from the hospital or clinic.  Ask your health care provider how your surgical site will be marked or identified. What happens during the procedure?   To reduce your risk of infection: ? Your health care team will wash or sanitize their hands. ? Your skin will be washed with soap. ? Hair may be removed from the surgical area.  An IV tube may be inserted into one of your veins.  You will be given one or more of the following: ? A medicine to help you relax (sedative). ? A medicine to make you fall asleep (general anesthetic).  A breathing tube will be placed in your mouth.  Your surgeon will make several small cuts (incisions) in your abdomen.  The laparoscope will be inserted through one of the small incisions. The camera on the laparoscope will send images to a TV screen (monitor) in the operating room. This lets your surgeon see  inside your abdomen.  Air-like gas will be pumped into your abdomen. This will expand your abdomen to give the surgeon more room to perform the surgery.  Other tools that are needed for the procedure will be inserted through the other incisions. The gallbladder will be removed through one of the incisions.  Your common bile duct may be examined. If stones are found in the common bile duct, they may be removed.  After your gallbladder has been removed, the incisions will be closed with stitches (sutures), staples, or skin glue.  Your incisions may be covered with a bandage (dressing). The procedure may vary among health care providers and hospitals. What happens after the procedure?  Your blood pressure, heart rate, breathing rate, and blood oxygen level will be monitored until the medicines you were given have worn off.  You will be given medicines as needed to control your pain.  Do not drive for 24 hours if you were given a sedative. This information is not intended to replace advice given to you by your health care provider. Make sure you discuss any questions you have with your health care provider. Document Released: 02/11/2005 Document Revised: 01/09/2017 Document Reviewed: 07/31/2015 Elsevier Interactive Patient Education  2019 Elsevier Inc.  

## 2018-02-12 NOTE — Progress Notes (Signed)
[facility] History and Physical  Reason for Referral: Gallstones  Referring Physician:  Terie PurserSamantha Jackson PA   Chief Complaint    Cholelithiasis      Christopher Hampton is a 47 y.o. male.  HPI: Christopher Hampton is a 47 yo who comes in with complaints of epigastric/ RUQ pain and bloating / nausea. He has had this feeling for several weeks now and it has not improved. He was seen in the ED and found to have gallstones. Initially he had a US that showed sludge and concern for a mass like fatty infiltrate. A CT was done that just shows a fatty liver but no mass.  Since that visit, he has also started a PPI which has not helped his pain. He reports that he feels full and has some reflux, and that the pain is constant and achy at this time. He can press on this right side and notes tenderness.  He reports regular stools daily to every other day and occasional loose stools. He has lost some weight after his diabetes diagnosis.  He denies use of NSAIDs.   Past Medical History:  Diagnosis Date  . Diabetes mellitus without complication [facility](HCC)     Past Surgical History:  Procedure Laterality Date  . BACK SURGERY      Family History  Problem Relation Age of Onset  . Epilepsy Father     Social History   Tobacco Use  . Smoking status: Former Smoker    Last attempt to quit: 01/14/2018    Years since quitting: 0.0  . Smokeless tobacco: Current User    Types: Chew  Substance Use Topics  . Alcohol use: Yes    Comment: daily  . Drug use: No    Medications: I have reviewed the patient's current medications. Allergies as of 02/12/2018   No Known Allergies     Medication List       Accurate as of February 12, 2018 11:59 PM. Always use your most recent med list.        aspirin EC 81 MG tablet Take 81 mg by mouth daily.   calcium carbonate 500 MG chewable tablet Commonly known as:  TUMS - dosed in mg elemental calcium Chew 2-3 tablets by mouth 3 (three) times daily as  needed for indigestion or heartburn.   lisinopril-hydrochlorothiazide 10-12.5 MG tablet Commonly known as:  PRINZIDE,ZESTORETIC Take 1 tablet by mouth daily.   metFORMIN 500 MG tablet Commonly known as:  GLUCOPHAGE Take 500 mg by mouth daily.   omeprazole 20 MG capsule Commonly known as:  PRILOSEC Take 20 mg by mouth daily.        ROS:  A comprehensive review of systems was negative except for: Gastrointestinal: positive for abdominal pain, nausea and bloating Musculoskeletal: positive for back pain and stiff joints Allergic/Immunologic: positive for hay fever  Blood pressure (!) 142/84, pulse 89, temperature 99.5 F (37.5 C), temperature source Temporal, resp. rate 20, weight 218 lb 3.2 oz (99 kg). Physical Exam Vitals signs reviewed.  Constitutional:      Appearance: Normal appearance.  HENT:     Head: Normocephalic and atraumatic.     Mouth/Throat:     Mouth: Mucous membranes are moist.  Eyes:     Extraocular Movements: Extraocular movements intact.     Pupils: Pupils are equal, round, and reactive to light.  Neck:     Musculoskeletal: Normal range of motion.  Cardiovascular:     Rate and Rhythm: Normal rate and regular rhythm.  Pulmonary:     Effort: Pulmonary effort is normal.     Breath sounds: Normal breath sounds.  Abdominal:     General: There is no distension.     Palpations: Abdomen is soft.     Tenderness: There is abdominal tenderness in the right upper quadrant and epigastric area.  Musculoskeletal: Normal range of motion.        General: No swelling.  Skin:    General: Skin is warm and dry.  Neurological:     General: No focal deficit present.     Mental Status: He is alert and oriented to person, place, and time.  Psychiatric:        Mood and Affect: Mood normal.        Behavior: Behavior normal.        Thought Content: Thought content normal.        Judgment: Judgment normal.     Results: Results for Christopher Hampton, Christopher "CHRIS" (MRN  [id number]) as of 02/12/2018 15:02  Ref. Range 01/28/2018 11:07  Sodium Latest Ref Range: 135 - 145 mmol/L 134 (L)  Potassium Latest Ref Range: 3.5 - 5.1 mmol/L 4.2  Chloride Latest Ref Range: 98 - 111 mmol/L 101  CO2 Latest Ref Range: 22 - 32 mmol/L 24  Glucose Latest Ref Range: 70 - 99 mg/dL 409176 (H)  BUN Latest Ref Range: 6 - 20 mg/dL 13  Creatinine Latest Ref Range: 0.61 - 1.24 mg/dL 8.110.90  Calcium Latest Ref Range: 8.9 - 10.3 mg/dL 9.5  Anion gap Latest Ref Range: 5 - 15  9  Alkaline Phosphatase Latest Ref Range: 38 - 126 U/L 72  Albumin Latest Ref Range: 3.5 - 5.0 g/dL 4.4  Lipase Latest Ref Range: 11 - 51 U/L 28  AST Latest Ref Range: 15 - 41 U/L 45 (H)  ALT Latest Ref Range: 0 - 44 U/L 69 (H)  Total Protein Latest Ref Range: 6.5 - 8.1 g/dL 7.7  Total Bilirubin Latest Ref Range: 0.3 - 1.2 mg/dL 1.2  GFR, Est Non African American Latest Ref Range: >60 mL/min >60  GFR, Est African American Latest Ref Range: >60 mL/min >60   Personally reviewed imaging- stones, no mass mass in liver/ fatty liver  RUQ US- IMPRESSION: Negative for gallstones or evidence of acute cholecystitis. Small volume of sludge in the gallbladder noted.  Fatty infiltration of the liver.  CT a/p-  IMPRESSION: 1. Steatosis of the liver without space-occupying mass. 2. Nonobstructing calculus in the lower pole left kidney. No evidence of hydroureteronephrosis. 3. Aortoiliac atherosclerosis without aneurysm. 4. Stable osteochondroma off the lesser trochanter of the left femur.   Assessment & Plan:  Christopher MylarCharles Hackbart is a 47 y.o. male with sludge/ biliary colic. We have discussed the gallbladder and gallbladder pathology as well as other reasons from pain in this region including gastritis/ ulcer disease.  We discussed that his symptoms should improve somewhat with cholecystectomy.  Discussed that sludge is normally gravel like stones.  PLAN: I counseled the patient about the indication, risks and benefits of  laparoscopic cholecystectomy.  He understands there is a very small chance for bleeding, infection, injury to normal structures (including common bile duct), conversion to open surgery, persistent symptoms, evolution of postcholecystectomy diarrhea, need for secondary interventions, anesthesia reaction, cardiopulmonary issues and other risks not specifically detailed here. I described the expected recovery, the plan for follow-up and the restrictions during the recovery phase.  All questions were answered.  Lap chole in near future.   Leatrice JewelsLindsay C  Marcques Wrightsman 02/13/2018, 1:45 PM

## 2018-02-13 ENCOUNTER — Encounter: Payer: Self-pay | Admitting: General Surgery

## 2018-02-13 NOTE — H&P (Signed)
[facility] History and Physical  Reason for Referral: Gallstones  Referring Physician:  Terie Purser PA      Chief Complaint    Cholelithiasis      Christopher Hampton is a 47 y.o. male.  HPI: Christopher Hampton is a 47 yo who comes in with complaints of epigastric/ RUQ pain and bloating / nausea. He has had this feeling for several weeks now and it has not improved. He was seen in the ED and found to have gallstones. Initially he had a [place] that showed sludge and concern for a mass like fatty infiltrate. A CT was done that just shows a fatty liver but no mass.  Since that visit, he has also started a PPI which has not helped his pain. He reports that he feels full and has some reflux, and that the pain is constant and achy at this time. He can press on this right side and notes tenderness.  He reports regular stools daily to every other day and occasional loose stools. He has lost some weight after his diabetes diagnosis.  He denies use of NSAIDs.       Past Medical History:  Diagnosis Date  . Diabetes mellitus without complication [facility])          Past Surgical History:  Procedure Laterality Date  . BACK SURGERY           Family History  Problem Relation Age of Onset  . Epilepsy Father     Social History   Tobacco Use  . Smoking status: Former Smoker    Last attempt to quit: 01/14/2018    Years since quitting: 0.0  . Smokeless tobacco: Current User    Types: Chew  Substance Use Topics  . Alcohol use: Yes    Comment: daily  . Drug use: No    Medications: I have reviewed the patient's current medications. Allergies as of 02/12/2018   No Known Allergies        Medication List       Accurate as of February 12, 2018 11:59 PM. Always use your most recent med list.        aspirin EC 81 MG tablet Take 81 mg by mouth daily.   calcium carbonate 500 MG chewable tablet Commonly known as:  TUMS - dosed in mg elemental  calcium Chew 2-3 tablets by mouth 3 (three) times daily as needed for indigestion or heartburn.   lisinopril-hydrochlorothiazide 10-12.5 MG tablet Commonly known as:  PRINZIDE,ZESTORETIC Take 1 tablet by mouth daily.   metFORMIN 500 MG tablet Commonly known as:  GLUCOPHAGE Take 500 mg by mouth daily.   omeprazole 20 MG capsule Commonly known as:  PRILOSEC Take 20 mg by mouth daily.        ROS:  A comprehensive review of systems was negative except for: Gastrointestinal: positive for abdominal pain, nausea and bloating Musculoskeletal: positive for back pain and stiff joints Allergic/Immunologic: positive for hay fever  Blood pressure (!) 142/84, pulse 89, temperature 99.5 F (37.5 C), temperature source Temporal, resp. rate 20, weight 218 lb 3.2 oz (99 kg). Physical Exam Vitals signs reviewed.  Constitutional:      Appearance: Normal appearance.  HENT:     Head: Normocephalic and atraumatic.     Mouth/Throat:     Mouth: Mucous membranes are moist.  Eyes:     Extraocular Movements: Extraocular movements intact.     Pupils: Pupils are equal, round, and reactive to light.  Neck:  Musculoskeletal: Normal range of motion.  Cardiovascular:     Rate and Rhythm: Normal rate and regular rhythm.  Pulmonary:     Effort: Pulmonary effort is normal.     Breath sounds: Normal breath sounds.  Abdominal:     General: There is no distension.     Palpations: Abdomen is soft.     Tenderness: There is abdominal tenderness in the right upper quadrant and epigastric area.  Musculoskeletal: Normal range of motion.        General: No swelling.  Skin:    General: Skin is warm and dry.  Neurological:     General: No focal deficit present.     Mental Status: He is alert and oriented to person, place, and time.  Psychiatric:        Mood and Affect: Mood normal.        Behavior: Behavior normal.        Thought Content: Thought content normal.        Judgment: Judgment  normal.     Results: Results for Christopher Hampton, Christopher "CHRIS" (MRN [id number]) as of 02/12/2018 15:02  Ref. Range 01/28/2018 11:07  Sodium Latest Ref Range: 135 - 145 mmol/L 134 (L)  Potassium Latest Ref Range: 3.5 - 5.1 mmol/L 4.2  Chloride Latest Ref Range: 98 - 111 mmol/L 101  CO2 Latest Ref Range: 22 - 32 mmol/L 24  Glucose Latest Ref Range: 70 - 99 mg/dL 409 (H)  BUN Latest Ref Range: 6 - 20 mg/dL 13  Creatinine Latest Ref Range: 0.61 - 1.24 mg/dL 8.11  Calcium Latest Ref Range: 8.9 - 10.3 mg/dL 9.5  Anion gap Latest Ref Range: 5 - 15  9  Alkaline Phosphatase Latest Ref Range: 38 - 126 U/L 72  Albumin Latest Ref Range: 3.5 - 5.0 g/dL 4.4  Lipase Latest Ref Range: 11 - 51 U/L 28  AST Latest Ref Range: 15 - 41 U/L 45 (H)  ALT Latest Ref Range: 0 - 44 U/L 69 (H)  Total Protein Latest Ref Range: 6.5 - 8.1 g/dL 7.7  Total Bilirubin Latest Ref Range: 0.3 - 1.2 mg/dL 1.2  GFR, Est Non African American Latest Ref Range: >60 mL/min >60  GFR, Est African American Latest Ref Range: >60 mL/min >60   Personally reviewed imaging- stones, no mass mass in liver/ fatty liver  RUQ US- IMPRESSION: Negative for gallstones or evidence of acute cholecystitis. Small volume of sludge in the gallbladder noted.  Fatty infiltration of the liver.  CT a/p-  IMPRESSION: 1. Steatosis of the liver without space-occupying mass. 2. Nonobstructing calculus in the lower pole left kidney. No evidence of hydroureteronephrosis. 3. Aortoiliac atherosclerosis without aneurysm. 4. Stable osteochondroma off the lesser trochanter of the left femur.   Assessment & Plan:  Christopher Hampton is a 47 y.o. male with sludge/ biliary colic. We have discussed the gallbladder and gallbladder pathology as well as other reasons from pain in this region including gastritis/ ulcer disease.  We discussed that his symptoms should improve somewhat with cholecystectomy.  Discussed that sludge is normally gravel like stones.    PLAN: I counseled the patient about the indication, risks and benefits of laparoscopic cholecystectomy.  He understands there is a very small chance for bleeding, infection, injury to normal structures (including common bile duct), conversion to open surgery, persistent symptoms, evolution of postcholecystectomy diarrhea, need for secondary interventions, anesthesia reaction, cardiopulmonary issues and other risks not specifically detailed here. I described the expected recovery, the plan for follow-up and  the restrictions during the recovery phase.  All questions were answered.  Lap chole in near future.   Lucretia Roers 02/13/2018, 1:45 PM

## 2018-02-13 NOTE — Patient Instructions (Signed)
Fue Cervenka  02/13/2018     @PREFPERIOPPHARMACY @   Your procedure is scheduled on  02/23/2018 .  Report to [facility] at  900  A.M.  Call this number if you have problems the morning of surgery:  [phone]   Remember:  Do not eat or drink after midnight.                     Take these medicines the morning of surgery with A SIP OF WATER  Lisinopril, prilosec.    Do not wear jewelry, make-up or nail polish.  Do not wear lotions, powders, or perfumes, or deodorant.  Do not shave 48 hours prior to surgery.  Men may shave face and neck.  Do not bring valuables to the hospital.  [facility] is not responsible for any belongings or valuables.  Contacts, dentures or bridgework may not be worn into surgery.  Leave your suitcase in the car.  After surgery it may be brought to your room.  For patients admitted to the hospital, discharge time will be determined by your treatment team.  Patients discharged the day of surgery will not be allowed to drive home.   Name and phone number of your driver:   family Special instructions:  None  Please read over the following fact sheets that you were given. Anesthesia Post-op Instructions and Care and Recovery After Surgery       Laparoscopic Cholecystectomy Laparoscopic cholecystectomy is surgery to remove the gallbladder. The gallbladder is a pear-shaped organ that lies beneath the liver on the right side of the body. The gallbladder stores bile, which is a fluid that helps the body to digest fats. Cholecystectomy is often done for inflammation of the gallbladder (cholecystitis). This condition is usually caused by a buildup of gallstones (cholelithiasis) in the gallbladder. Gallstones can block the flow of bile, which can result in inflammation and pain. In severe cases, emergency surgery may be required. This procedure is done though small incisions in your abdomen (laparoscopic surgery). A thin scope with a camera  (laparoscope) is inserted through one incision. Thin surgical instruments are inserted through the other incisions. In some cases, a laparoscopic procedure may be turned into a type of surgery that is done through a larger incision (open surgery). Tell a health care provider about:  Any allergies you have.  All medicines you are taking, including vitamins, herbs, eye drops, creams, and over-the-counter medicines.  Any problems you or family members have had with anesthetic medicines.  Any blood disorders you have.  Any surgeries you have had.  Any medical conditions you have.  Whether you are pregnant or may be pregnant. What are the risks? Generally, this is a safe procedure. However, problems may occur, including:  Infection.  Bleeding.  Allergic reactions to medicines.  Damage to other structures or organs.  A stone remaining in the common bile duct. The common bile duct carries bile from the gallbladder into the small intestine.  A bile leak from the cyst duct that is clipped when your gallbladder is removed. What happens before the procedure? Staying hydrated Follow instructions from your health care provider about hydration, which may include:  Up to 2 hours before the procedure - you may continue to drink clear liquids, such as water, clear fruit juice, black coffee, and plain tea. Eating and drinking restrictions Follow instructions from your health care provider about eating and drinking, which may include:  8 hours before the procedure - stop eating heavy meals or foods such as meat, fried foods, or fatty foods.  6 hours before the procedure - stop eating light meals or foods, such as toast or cereal.  6 hours before the procedure - stop drinking milk or drinks that contain milk.  2 hours before the procedure - stop drinking clear liquids. Medicines  Ask your health care provider about: ? Changing or stopping your regular medicines. This is especially  important if you are taking diabetes medicines or blood thinners. ? Taking medicines such as aspirin and ibuprofen. These medicines can thin your blood. Do not take these medicines before your procedure if your health care provider instructs you not to.  You may be given antibiotic medicine to help prevent infection. General instructions  Let your health care provider know if you develop a cold or an infection before surgery.  Plan to have someone take you home from the hospital or clinic.  Ask your health care provider how your surgical site will be marked or identified. What happens during the procedure?   To reduce your risk of infection: ? Your health care team will wash or sanitize their hands. ? Your skin will be washed with soap. ? Hair may be removed from the surgical area.  An IV tube may be inserted into one of your veins.  You will be given one or more of the following: ? A medicine to help you relax (sedative). ? A medicine to make you fall asleep (general anesthetic).  A breathing tube will be placed in your mouth.  Your surgeon will make several small cuts (incisions) in your abdomen.  The laparoscope will be inserted through one of the small incisions. The camera on the laparoscope will send images to a TV screen (monitor) in the operating room. This lets your surgeon see inside your abdomen.  Air-like gas will be pumped into your abdomen. This will expand your abdomen to give the surgeon more room to perform the surgery.  Other tools that are needed for the procedure will be inserted through the other incisions. The gallbladder will be removed through one of the incisions.  Your common bile duct may be examined. If stones are found in the common bile duct, they may be removed.  After your gallbladder has been removed, the incisions will be closed with stitches (sutures), staples, or skin glue.  Your incisions may be covered with a bandage (dressing). The  procedure may vary among health care providers and hospitals. What happens after the procedure?  Your blood pressure, heart rate, breathing rate, and blood oxygen level will be monitored until the medicines you were given have worn off.  You will be given medicines as needed to control your pain.  Do not drive for 24 hours if you were given a sedative. This information is not intended to replace advice given to you by your health care provider. Make sure you discuss any questions you have with your health care provider. Document Released: 02/11/2005 Document Revised: 01/09/2017 Document Reviewed: 07/31/2015 Elsevier Interactive Patient Education  2019 [employer]  Laparoscopic Cholecystectomy, Care After This sheet gives you information about how to care for yourself after your procedure. Your health care provider may also give you more specific instructions. If you have problems or questions, contact your health care provider. What can I expect after the procedure? After the procedure, it is common to have:  Pain at your incision sites. You will be given  medicines to control this pain.  Mild nausea or vomiting.  Bloating and possible shoulder pain from the air-like gas that was used during the procedure. Follow these instructions at home: Incision care   Follow instructions from your health care provider about how to take care of your incisions. Make sure you: ? Wash your hands with soap and water before you change your bandage (dressing). If soap and water are not available, use hand sanitizer. ? Change your dressing as told by your health care provider. ? Leave stitches (sutures), skin glue, or adhesive strips in place. These skin closures may need to be in place for 2 weeks or longer. If adhesive strip edges start to loosen and curl up, you may trim the loose edges. Do not remove adhesive strips completely unless your health care provider tells you to do that.  Do not take  baths, swim, or use a hot tub until your health care provider approves. Ask your health care provider if you can take showers. You may only be allowed to take sponge baths for bathing.  Check your incision area every day for signs of infection. Check for: ? More redness, swelling, or pain. ? More fluid or blood. ? Warmth. ? Pus or a bad smell. Activity  Do not drive or use heavy machinery while taking prescription pain medicine.  Do not lift anything that is heavier than 10 lb (4.5 kg) until your health care provider approves.  Do not play contact sports until your health care provider approves.  Do not drive for 24 hours if you were given a medicine to help you relax (sedative).  Rest as needed. Do not return to work or school until your health care provider approves. General instructions  Take over-the-counter and prescription medicines only as told by your health care provider.  To prevent or treat constipation while you are taking prescription pain medicine, your health care provider may recommend that you: ? Drink enough fluid to keep your urine clear or pale yellow. ? Take over-the-counter or prescription medicines. ? Eat foods that are high in fiber, such as fresh fruits and vegetables, whole grains, and beans. ? Limit foods that are high in fat and processed sugars, such as fried and sweet foods. Contact a health care provider if:  You develop a rash.  You have more redness, swelling, or pain around your incisions.  You have more fluid or blood coming from your incisions.  Your incisions feel warm to the touch.  You have pus or a bad smell coming from your incisions.  You have a fever.  One or more of your incisions breaks open. Get help right away if:  You have trouble breathing.  You have chest pain.  You have increasing pain in your shoulders.  You faint or feel dizzy when you stand.  You have severe pain in your abdomen.  You have nausea or vomiting  that lasts for more than one day.  You have leg pain. This information is not intended to replace advice given to you by your health care provider. Make sure you discuss any questions you have with your health care provider. Document Released: 02/11/2005 Document Revised: 09/02/2015 Document Reviewed: 07/31/2015 Elsevier Interactive Patient Education  2019 [employer] Inc[employer]  General Anesthesia, Adult General anesthesia is the use of medicines to make a person "go to sleep" (unconscious) for a medical procedure. General anesthesia must be used for certain procedures, and is often recommended for procedures that:  Last a long  time.  Require you to be still or in an unusual position.  Are major and can cause blood loss. The medicines used for general anesthesia are called general anesthetics. As well as making you unconscious for a certain amount of time, these medicines:  Prevent pain.  Control your blood pressure.  Relax your muscles. Tell a health care provider about:  Any allergies you have.  All medicines you are taking, including vitamins, herbs, eye drops, creams, and over-the-counter medicines.  Any problems you or family members have had with anesthetic medicines.  Types of anesthetics you have had in the past.  Any blood disorders you have.  Any surgeries you have had.  Any medical conditions you have.  Any recent upper respiratory, chest, or ear infections.  Any history of: ? Heart or lung conditions, such as heart failure, sleep apnea, asthma, or chronic obstructive pulmonary disease (COPD). ? Financial plannerMilitary service. ? Depression or anxiety.  Any tobacco or drug use, including marijuana or alcohol use.  Whether you are pregnant or may be pregnant. What are the risks? Generally, this is a safe procedure. However, problems may occur, including:  Allergic reaction.  Lung and heart problems.  Inhaling food or liquid from the stomach into the lungs  (aspiration).  Nerve injury.  Dental injury.  Air in the bloodstream, which can lead to stroke.  Extreme agitation or confusion (delirium) when you wake up from the anesthetic.  Waking up during your procedure and being unable to move. This is rare. These problems are more likely to develop if you are having a major surgery or if you have an advanced or serious medical condition. You can prevent some of these complications by answering all of your health care provider's questions thoroughly and by following all instructions before your procedure. General anesthesia can cause side effects, including:  Nausea or vomiting.  A sore throat from the breathing tube.  Hoarseness.  Wheezing or coughing.  Shaking chills.  Tiredness.  Body aches.  Anxiety.  Sleepiness or drowsiness.  Confusion or agitation. What happens before the procedure? Staying hydrated Follow instructions from your health care provider about hydration, which may include:  Up to 2 hours before the procedure - you may continue to drink clear liquids, such as water, clear fruit juice, black coffee, and plain tea.  Eating and drinking restrictions Follow instructions from your health care provider about eating and drinking, which may include:  8 hours before the procedure - stop eating heavy meals or foods such as meat, fried foods, or fatty foods.  6 hours before the procedure - stop eating light meals or foods, such as toast or cereal.  6 hours before the procedure - stop drinking milk or drinks that contain milk.  2 hours before the procedure - stop drinking clear liquids. Medicines Ask your health care provider about:  Changing or stopping your regular medicines. This is especially important if you are taking diabetes medicines or blood thinners.  Taking medicines such as aspirin and ibuprofen. These medicines can thin your blood. Do not take these medicines unless your health care provider tells you  to take them.  Taking over-the-counter medicines, vitamins, herbs, and supplements. Do not take these during the week before your procedure unless your health care provider approves them. General instructions  Starting 3-6 weeks before the procedure, do not use any products that contain nicotine or tobacco, such as cigarettes and e-cigarettes. If you need help quitting, ask your health care provider.  If you  brush your teeth on the morning of the procedure, make sure to spit out all of the toothpaste.  Tell your health care provider if you become ill or develop a cold, cough, or fever.  If instructed by your health care provider, bring your sleep apnea device with you on the day of your surgery (if applicable).  Ask your health care provider if you will be going home the same day, the following day, or after a longer hospital stay. ? Plan to have someone take you home from the hospital or clinic. ? Plan to have a responsible adult care for you for at least 24 hours after you leave the hospital or clinic. This is important. What happens during the procedure?   You will be given anesthetics through both of the following: ? A mask placed over your nose and mouth. ? An IV in one of your veins.  You may receive a medicine to help you relax (sedative).  After you are unconscious, a breathing tube may be inserted down your throat to help you breathe. This will be removed before you wake up.  An anesthesia specialist will stay with you throughout your procedure. He or she will: ? Keep you comfortable and safe by continuing to give you medicines and adjusting the amount of medicine that you get. ? Monitor your blood pressure, pulse, and oxygen levels to make sure that the anesthetics do not cause any problems. The procedure may vary among health care providers and hospitals. What happens after the procedure?  Your blood pressure, temperature, heart rate, breathing rate, and blood oxygen level  will be monitored until the medicines you were given have worn off.  You will wake up in a recovery area. You may wake up slowly.  If you feel anxious or agitated, you may be given medicine to help you calm down.  If you will be going home the same day, your health care provider may check to make sure you can walk, drink, and urinate.  Your health care provider will treat any pain or side effects you have before you go home.  Do not drive for 24 hours if you were given a sedative. Summary  General anesthesia is used to keep you still and prevent pain during a procedure.  It is important to tell your health care provider about your medical history and any surgeries you have had, and previous experience with anesthesia.  Follow your health care provider's instructions about when to stop eating, drinking, or taking certain medicines before your procedure.  Plan to have someone take you home from the hospital or clinic. This information is not intended to replace advice given to you by your health care provider. Make sure you discuss any questions you have with your health care provider. Document Released: 05/21/2007 Document Revised: 07/01/2017 Document Reviewed: 09/27/2016 Elsevier Interactive Patient Education  2019 Elsevier Inc. General Anesthesia, Adult, Care After This sheet gives you information about how to care for yourself after your procedure. Your health care provider may also give you more specific instructions. If you have problems or questions, contact your health care provider. What can I expect after the procedure? After the procedure, the following side effects are common:  Pain or discomfort at the IV site.  Nausea.  Vomiting.  Sore throat.  Trouble concentrating.  Feeling cold or chills.  Weak or tired.  Sleepiness and fatigue.  Soreness and body aches. These side effects can affect parts of the body that were not  involved in surgery. Follow these  instructions at home:  For at least 24 hours after the procedure:  Have a responsible adult stay with you. It is important to have someone help care for you until you are awake and alert.  Rest as needed.  Do not: ? Participate in activities in which you could fall or become injured. ? Drive. ? Use heavy machinery. ? Drink alcohol. ? Take sleeping pills or medicines that cause drowsiness. ? Make important decisions or sign legal documents. ? Take care of children on your own. Eating and drinking  Follow any instructions from your health care provider about eating or drinking restrictions.  When you feel hungry, start by eating small amounts of foods that are soft and easy to digest (bland), such as toast. Gradually return to your regular diet.  Drink enough fluid to keep your urine pale yellow.  If you vomit, rehydrate by drinking water, juice, or clear broth. General instructions  If you have sleep apnea, surgery and certain medicines can increase your risk for breathing problems. Follow instructions from your health care provider about wearing your sleep device: ? Anytime you are sleeping, including during daytime naps. ? While taking prescription pain medicines, sleeping medicines, or medicines that make you drowsy.  Return to your normal activities as told by your health care provider. Ask your health care provider what activities are safe for you.  Take over-the-counter and prescription medicines only as told by your health care provider.  If you smoke, do not smoke without supervision.  Keep all follow-up visits as told by your health care provider. This is important. Contact a health care provider if:  You have nausea or vomiting that does not get better with medicine.  You cannot eat or drink without vomiting.  You have pain that does not get better with medicine.  You are unable to pass urine.  You develop a skin rash.  You have a fever.  You have redness  around your IV site that gets worse. Get help right away if:  You have difficulty breathing.  You have chest pain.  You have blood in your urine or stool, or you vomit blood. Summary  After the procedure, it is common to have a sore throat or nausea. It is also common to feel tired.  Have a responsible adult stay with you for the first 24 hours after general anesthesia. It is important to have someone help care for you until you are awake and alert.  When you feel hungry, start by eating small amounts of foods that are soft and easy to digest (bland), such as toast. Gradually return to your regular diet.  Drink enough fluid to keep your urine pale yellow.  Return to your normal activities as told by your health care provider. Ask your health care provider what activities are safe for you. This information is not intended to replace advice given to you by your health care provider. Make sure you discuss any questions you have with your health care provider. Document Released: 05/20/2000 Document Revised: 09/27/2016 Document Reviewed: 09/27/2016 Elsevier Interactive Patient Education  2019 [employer].

## 2018-02-17 ENCOUNTER — Encounter (HOSPITAL_COMMUNITY)
Admission: RE | Admit: 2018-02-17 | Discharge: 2018-02-17 | Disposition: A | Discharge status: Home / Self Care | Payer: PRIVATE HEALTH INSURANCE | Source: Ambulatory Visit | Attending: General Surgery | Admitting: General Surgery

## 2018-02-17 ENCOUNTER — Other Ambulatory Visit: Payer: Self-pay

## 2018-02-17 ENCOUNTER — Encounter (HOSPITAL_COMMUNITY): Payer: Self-pay

## 2018-02-17 DIAGNOSIS — Z01818 Encounter for other preprocedural examination: Secondary | ICD-10-CM | POA: Insufficient documentation

## 2018-02-17 DIAGNOSIS — K828 Other specified diseases of gallbladder: Secondary | ICD-10-CM | POA: Diagnosis not present

## 2018-02-17 HISTORY — DX: Gastro-esophageal reflux disease without esophagitis: K21.9

## 2018-02-17 LAB — BASIC METABOLIC PANEL
Anion gap: 8 (ref 5–15)
BUN: 14 mg/dL (ref 6–20)
CHLORIDE: 103 mmol/L (ref 98–111)
CO2: 23 mmol/L (ref 22–32)
Calcium: 9.2 mg/dL (ref 8.9–10.3)
Creatinine, Ser: 0.93 mg/dL (ref 0.61–1.24)
GFR calc Af Amer: >60 mL/min (ref >60)
GFR calc non Af Amer: >60 mL/min (ref >60)
Glucose, Bld: 187 mg/dL — ABNORMAL HIGH (ref 70–99)
POTASSIUM: 4.1 mmol/L (ref 3.5–5.1)
Sodium: 134 mmol/L — ABNORMAL LOW (ref 135–145)

## 2018-02-17 LAB — GLUCOSE, CAPILLARY: Glucose-Capillary: 174 mg/dL — ABNORMAL HIGH (ref 70–99)

## 2018-02-18 LAB — HEMOGLOBIN A1C
Hgb A1c MFr Bld: 6.5 % — ABNORMAL HIGH (ref 4.8–5.6)
Mean Plasma Glucose: 140 mg/dL

## 2018-02-20 NOTE — Pre-Procedure Instructions (Signed)
HgbA1C routed to PCP. 

## 2018-02-23 ENCOUNTER — Ambulatory Visit (HOSPITAL_COMMUNITY): Payer: PRIVATE HEALTH INSURANCE | Admitting: Anesthesiology

## 2018-02-23 ENCOUNTER — Encounter (HOSPITAL_COMMUNITY)
Admission: RE | Disposition: A | Discharge status: Home / Self Care | Payer: Self-pay | Source: Home / Self Care | Attending: General Surgery

## 2018-02-23 ENCOUNTER — Ambulatory Visit (HOSPITAL_COMMUNITY)
Admission: RE | Admit: 2018-02-23 | Discharge: 2018-02-23 | Disposition: A | Discharge status: Home / Self Care | Payer: PRIVATE HEALTH INSURANCE | Attending: General Surgery | Admitting: General Surgery

## 2018-02-23 ENCOUNTER — Other Ambulatory Visit: Payer: Self-pay

## 2018-02-23 ENCOUNTER — Encounter (HOSPITAL_COMMUNITY): Payer: Self-pay | Admitting: Anesthesiology

## 2018-02-23 DIAGNOSIS — E119 Type 2 diabetes mellitus without complications: Secondary | ICD-10-CM | POA: Diagnosis not present

## 2018-02-23 DIAGNOSIS — K8020 Calculus of gallbladder without cholecystitis without obstruction: Secondary | ICD-10-CM

## 2018-02-23 DIAGNOSIS — Z87891 Personal history of nicotine dependence: Secondary | ICD-10-CM | POA: Diagnosis not present

## 2018-02-23 DIAGNOSIS — K811 Chronic cholecystitis: Secondary | ICD-10-CM | POA: Insufficient documentation

## 2018-02-23 DIAGNOSIS — N200 Calculus of kidney: Secondary | ICD-10-CM | POA: Diagnosis not present

## 2018-02-23 DIAGNOSIS — K219 Gastro-esophageal reflux disease without esophagitis: Secondary | ICD-10-CM | POA: Insufficient documentation

## 2018-02-23 DIAGNOSIS — K828 Other specified diseases of gallbladder: Secondary | ICD-10-CM | POA: Diagnosis not present

## 2018-02-23 DIAGNOSIS — K760 Fatty (change of) liver, not elsewhere classified: Secondary | ICD-10-CM | POA: Insufficient documentation

## 2018-02-23 DIAGNOSIS — Z79899 Other long term (current) drug therapy: Secondary | ICD-10-CM | POA: Diagnosis not present

## 2018-02-23 DIAGNOSIS — Z7984 Long term (current) use of oral hypoglycemic drugs: Secondary | ICD-10-CM | POA: Insufficient documentation

## 2018-02-23 DIAGNOSIS — K8050 Calculus of bile duct without cholangitis or cholecystitis without obstruction: Secondary | ICD-10-CM | POA: Diagnosis present

## 2018-02-23 DIAGNOSIS — Z7982 Long term (current) use of aspirin: Secondary | ICD-10-CM | POA: Diagnosis not present

## 2018-02-23 DIAGNOSIS — I10 Essential (primary) hypertension: Secondary | ICD-10-CM | POA: Insufficient documentation

## 2018-02-23 HISTORY — PX: CHOLECYSTECTOMY: SHX55

## 2018-02-23 LAB — GLUCOSE, CAPILLARY
Glucose-Capillary: 243 mg/dL — ABNORMAL HIGH (ref 70–99)
Glucose-Capillary: 239 mg/dL — ABNORMAL HIGH (ref 70–99)

## 2018-02-23 SURGERY — LAPAROSCOPIC CHOLECYSTECTOMY
Anesthesia: General

## 2018-02-23 MED ORDER — PROPOFOL 10 MG/ML IV BOLUS
INTRAVENOUS | Status: AC
  Filled 2018-02-23: qty 20

## 2018-02-23 MED ORDER — MIDAZOLAM HCL 5 MG/5ML IJ SOLN
INTRAMUSCULAR | Status: DC | PRN
  Administered 2018-02-23: 2 mg via INTRAVENOUS

## 2018-02-23 MED ORDER — DEXAMETHASONE SODIUM PHOSPHATE 4 MG/ML IJ SOLN
INTRAMUSCULAR | Status: DC | PRN
  Administered 2018-02-23: 5 mg via INTRAVENOUS

## 2018-02-23 MED ORDER — SODIUM CHLORIDE 0.9 % IR SOLN
Status: DC | PRN
  Administered 2018-02-23: 1000 mL

## 2018-02-23 MED ORDER — HYDROMORPHONE HCL 1 MG/ML IJ SOLN
INTRAMUSCULAR | Status: AC
  Filled 2018-02-23: qty 0.5

## 2018-02-23 MED ORDER — LIDOCAINE HCL 1 % IJ SOLN
INTRAMUSCULAR | Status: DC | PRN
  Administered 2018-02-23: 60 mg via INTRADERMAL

## 2018-02-23 MED ORDER — OXYCODONE HCL 5 MG PO TABS: 5.0000 mg | ORAL_TABLET | ORAL | 0 refills | Status: DC | PRN

## 2018-02-23 MED ORDER — DOCUSATE SODIUM 100 MG PO CAPS: 100.0000 mg | ORAL_CAPSULE | Freq: Two times a day (BID) | ORAL | 2 refills | Status: DC

## 2018-02-23 MED ORDER — ONDANSETRON HCL 4 MG/2ML IJ SOLN
INTRAMUSCULAR | Status: DC | PRN
  Administered 2018-02-23: 4 mg via INTRAVENOUS

## 2018-02-23 MED ORDER — CHLORHEXIDINE GLUCONATE CLOTH 2 % EX PADS: 6.0000 | MEDICATED_PAD | Freq: Once | CUTANEOUS | Status: DC

## 2018-02-23 MED ORDER — LIDOCAINE 2% (20 MG/ML) 5 ML SYRINGE
INTRAMUSCULAR | Status: AC
  Filled 2018-02-23: qty 5

## 2018-02-23 MED ORDER — SODIUM CHLORIDE 0.9 % IV SOLN
INTRAVENOUS | Status: AC
  Filled 2018-02-23: qty 2

## 2018-02-23 MED ORDER — PROPOFOL 10 MG/ML IV BOLUS
INTRAVENOUS | Status: DC | PRN
  Administered 2018-02-23: 170 mg via INTRAVENOUS
  Administered 2018-02-23: 30 mg via INTRAVENOUS

## 2018-02-23 MED ORDER — ONDANSETRON HCL 4 MG/2ML IJ SOLN: 4.0000 mg | Freq: Once | INTRAMUSCULAR | Status: DC | PRN

## 2018-02-23 MED ORDER — SUGAMMADEX SODIUM 200 MG/2ML IV SOLN
INTRAVENOUS | Status: DC | PRN
  Administered 2018-02-23: 200 mg via INTRAVENOUS

## 2018-02-23 MED ORDER — BUPIVACAINE HCL (PF) 0.5 % IJ SOLN
INTRAMUSCULAR | Status: DC | PRN
  Administered 2018-02-23: 10 mL

## 2018-02-23 MED ORDER — SUCCINYLCHOLINE CHLORIDE 200 MG/10ML IV SOSY
PREFILLED_SYRINGE | INTRAVENOUS | Status: AC
  Filled 2018-02-23: qty 10

## 2018-02-23 MED ORDER — SUCCINYLCHOLINE CHLORIDE 20 MG/ML IJ SOLN
INTRAMUSCULAR | Status: DC | PRN
  Administered 2018-02-23: 170 mg via INTRAVENOUS

## 2018-02-23 MED ORDER — ROCURONIUM BROMIDE 10 MG/ML (PF) SYRINGE
PREFILLED_SYRINGE | INTRAVENOUS | Status: AC
  Filled 2018-02-23: qty 20

## 2018-02-23 MED ORDER — LACTATED RINGERS IV SOLN
INTRAVENOUS | Status: DC
  Administered 2018-02-23 (×2): via INTRAVENOUS

## 2018-02-23 MED ORDER — FENTANYL CITRATE (PF) 250 MCG/5ML IJ SOLN
INTRAMUSCULAR | Status: AC
  Filled 2018-02-23: qty 5

## 2018-02-23 MED ORDER — HYDROCODONE-ACETAMINOPHEN 7.5-325 MG PO TABS
1.0000 | ORAL_TABLET | Freq: Once | ORAL | Status: AC | PRN
  Administered 2018-02-23: 1 via ORAL
  Filled 2018-02-23: qty 1

## 2018-02-23 MED ORDER — BUPIVACAINE HCL (PF) 0.5 % IJ SOLN
INTRAMUSCULAR | Status: AC
  Filled 2018-02-23: qty 30

## 2018-02-23 MED ORDER — ROCURONIUM BROMIDE 100 MG/10ML IV SOLN
INTRAVENOUS | Status: DC | PRN
  Administered 2018-02-23: 35 mg via INTRAVENOUS
  Administered 2018-02-23: 5 mg via INTRAVENOUS
  Administered 2018-02-23: 10 mg via INTRAVENOUS

## 2018-02-23 MED ORDER — FENTANYL CITRATE (PF) 100 MCG/2ML IJ SOLN
INTRAMUSCULAR | Status: DC | PRN
  Administered 2018-02-23: 50 ug via INTRAVENOUS
  Administered 2018-02-23: 100 ug via INTRAVENOUS
  Administered 2018-02-23: 50 ug via INTRAVENOUS

## 2018-02-23 MED ORDER — MIDAZOLAM HCL 2 MG/2ML IJ SOLN
INTRAMUSCULAR | Status: AC
  Filled 2018-02-23: qty 2

## 2018-02-23 MED ORDER — SODIUM CHLORIDE 0.9 % IV SOLN
2.0000 g | INTRAVENOUS | Status: AC
  Administered 2018-02-23: 2 g via INTRAVENOUS

## 2018-02-23 MED ORDER — ONDANSETRON HCL 4 MG/2ML IJ SOLN
INTRAMUSCULAR | Status: AC
  Filled 2018-02-23: qty 2

## 2018-02-23 MED ORDER — DEXAMETHASONE SODIUM PHOSPHATE 10 MG/ML IJ SOLN
INTRAMUSCULAR | Status: AC
  Filled 2018-02-23: qty 1

## 2018-02-23 MED ORDER — SUGAMMADEX SODIUM 200 MG/2ML IV SOLN
INTRAVENOUS | Status: AC
  Filled 2018-02-23: qty 2

## 2018-02-23 MED ORDER — KETOROLAC TROMETHAMINE 30 MG/ML IJ SOLN
30.0000 mg | Freq: Once | INTRAMUSCULAR | Status: AC | PRN
  Administered 2018-02-23: 30 mg via INTRAVENOUS

## 2018-02-23 MED ORDER — KETOROLAC TROMETHAMINE 30 MG/ML IJ SOLN
INTRAMUSCULAR | Status: AC
  Filled 2018-02-23: qty 1

## 2018-02-23 MED ORDER — MEPERIDINE HCL 50 MG/ML IJ SOLN: 6.2500 mg | INTRAMUSCULAR | Status: DC | PRN

## 2018-02-23 MED ORDER — HEMOSTATIC AGENTS (NO CHARGE) OPTIME
TOPICAL | Status: DC | PRN
  Administered 2018-02-23 (×2): 1 via TOPICAL

## 2018-02-23 MED ORDER — HYDROMORPHONE HCL 1 MG/ML IJ SOLN
0.2500 mg | INTRAMUSCULAR | Status: DC | PRN
  Administered 2018-02-23: 0.5 mg via INTRAVENOUS

## 2018-02-23 SURGICAL SUPPLY — 52 items
ADH SKN CLS APL DERMABOND .7 (GAUZE/BANDAGES/DRESSINGS) ×1
APL SRG 38 LTWT LNG FL B (MISCELLANEOUS) ×1
APPLICATOR ARISTA FLEXITIP XL (MISCELLANEOUS) ×2 IMPLANT
APPLIER CLIP ROT 10 11.4 M/L (STAPLE) ×3
APR CLP MED LRG 11.4X10 (STAPLE) ×1
BAG RETRIEVAL 10 (BASKET) ×1
BAG RETRIEVAL 10MM (BASKET) ×1
BLADE SURG 15 STRL LF DISP TIS (BLADE) ×1 IMPLANT
BLADE SURG 15 STRL SS (BLADE) ×3
CHLORAPREP W/TINT 26ML (MISCELLANEOUS) ×3 IMPLANT
CLIP APPLIE ROT 10 11.4 M/L (STAPLE) ×1 IMPLANT
CLOTH BEACON ORANGE TIMEOUT ST (SAFETY) ×3 IMPLANT
COVER LIGHT HANDLE STERIS (MISCELLANEOUS) ×6 IMPLANT
COVER WAND RF STERILE (DRAPES) ×2 IMPLANT
DECANTER SPIKE VIAL GLASS SM (MISCELLANEOUS) ×3 IMPLANT
DERMABOND ADVANCED (GAUZE/BANDAGES/DRESSINGS) ×2
DERMABOND ADVANCED .7 DNX12 (GAUZE/BANDAGES/DRESSINGS) ×1 IMPLANT
ELECT REM PT RETURN 9FT ADLT (ELECTROSURGICAL) ×3
ELECTRODE REM PT RTRN 9FT ADLT (ELECTROSURGICAL) ×1 IMPLANT
FILTER SMOKE EVAC LAPAROSHD (FILTER) ×3 IMPLANT
GLOVE BIO SURGEON STRL SZ 6.5 (GLOVE) ×2 IMPLANT
GLOVE BIO SURGEON STRL SZ7.5 (GLOVE) ×2 IMPLANT
GLOVE BIO SURGEONS STRL SZ 6.5 (GLOVE) ×1
GLOVE BIOGEL M 7.0 STRL (GLOVE) ×2 IMPLANT
GLOVE BIOGEL PI IND STRL 6.5 (GLOVE) ×1 IMPLANT
GLOVE BIOGEL PI IND STRL 7.0 (GLOVE) ×3 IMPLANT
GLOVE BIOGEL PI INDICATOR 6.5 (GLOVE) ×2
GLOVE BIOGEL PI INDICATOR 7.0 (GLOVE) ×6
GOWN STRL REUS W/TWL LRG LVL3 (GOWN DISPOSABLE) ×9 IMPLANT
HEMOSTAT ARISTA ABSORB 3G PWDR (MISCELLANEOUS) ×2 IMPLANT
HEMOSTAT SNOW SURGICEL 2X4 (HEMOSTASIS) ×3 IMPLANT
INST SET LAPROSCOPIC AP (KITS) ×3 IMPLANT
KIT TURNOVER KIT A (KITS) ×3 IMPLANT
MANIFOLD NEPTUNE II (INSTRUMENTS) ×3 IMPLANT
NDL INSUFFLATION 14GA 120MM (NEEDLE) ×1 IMPLANT
NEEDLE INSUFFLATION 14GA 120MM (NEEDLE) ×3 IMPLANT
NS IRRIG 1000ML POUR BTL (IV SOLUTION) ×3 IMPLANT
PACK LAP CHOLE LZT030E (CUSTOM PROCEDURE TRAY) ×3 IMPLANT
PAD ARMBOARD 7.5X6 YLW CONV (MISCELLANEOUS) ×3 IMPLANT
SET BASIN LINEN APH (SET/KITS/TRAYS/PACK) ×3 IMPLANT
SLEEVE ENDOPATH XCEL 5M (ENDOMECHANICALS) ×3 IMPLANT
SUT MNCRL AB 4-0 PS2 18 (SUTURE) ×5 IMPLANT
SUT VICRYL 0 UR6 27IN ABS (SUTURE) ×3 IMPLANT
SYS BAG RETRIEVAL 10MM (BASKET) ×1
SYSTEM BAG RETRIEVAL 10MM (BASKET) ×1 IMPLANT
TROCAR ENDO BLADELESS 11MM (ENDOMECHANICALS) ×3 IMPLANT
TROCAR XCEL NON-BLD 5MMX100MML (ENDOMECHANICALS) ×3 IMPLANT
TROCAR XCEL UNIV SLVE 11M 100M (ENDOMECHANICALS) ×3 IMPLANT
TUBE CONNECTING 12'X1/4 (SUCTIONS) ×1
TUBE CONNECTING 12X1/4 (SUCTIONS) ×2 IMPLANT
TUBING INSUFFLATION (TUBING) ×3 IMPLANT
WARMER LAPAROSCOPE (MISCELLANEOUS) ×3 IMPLANT

## 2018-02-23 NOTE — Op Note (Signed)
Operative Note   Preoperative Diagnosis: Gallbladder Sludge, Biliary colic    Postoperative Diagnosis: Same   Procedure(s) Performed: Laparoscopic cholecystectomy   Surgeon: Lillia AbedLindsay C. Henreitta Leber, MD   Assistants: Franky MachoMark Jenkins, MD    Anesthesia: General endotracheal   Anesthesiologist: Shona NeedlesWynn, Vander M, MD    Specimens: Gallbladder    Estimated Blood Loss: Minimal    Blood Replacement: None    Complications: None    Operative Findings: Distended gallbladder, fatty nodular liver    Procedure: The patient was taken to the operating room and placed supine. General endotracheal anesthesia was induced. Intravenous antibiotics were administered per protocol. An orogastric tube positioned to decompress the stomach. The abdomen was prepared and draped in the usual sterile fashion.    A supraumbilical incision was made and a Veress technique was utilized to achieve pneumoperitoneum to 15 mmHg with carbon dioxide. A 11 mm optiview port was placed through the supraumbilical region, and a 10 mm 0-degree operative laparoscope was introduced. The area underlying the trocar and Veress needle were inspected and without evidence of injury.  Remaining trocars were placed under direct vision. Two 5 mm ports were placed in the right abdomen, between the anterior axillary and midclavicular line.  A final 11 mm port was placed through the mid-epigastrium, near the falciform ligament.    The gallbladder fundus was elevated cephalad and the infundibulum was retracted to the patient's right. The gallbladder/cystic duct junction was skeletonized. The cystic artery noted in the triangle of Calot and was also skeletonized.  We then continued liberal medial and lateral dissection until the critical view of safety was achieved. There appeared to be a cystic duct and two separate small cystic arteries come into the gallbladder.  They clearly went into the gallbladder and were on a long mesentery.     The cystic duct was  triply clipped and divided. The two vessels which seemed to be two cystic arteries were doubly clipped and divided. The gallbladder was then dissected from the liver bed with electrocautery.   The specimen was placed in an Endopouch and was retrieved through the epigastric site.   Final inspection revealed acceptable hemostasis. Surgical Jamelle HaringSnow and Arista were placed in the gallbladder bed. Trocars were removed and pneumoperitoneum was released.  The epigastric and umbilical sites were too small to need to close. Skin incisions were closed with 4-0 Monocryl subcuticular sutures and Dermabond. The patient was awakened from anesthesia and extubated without complication.    Algis GreenhouseLindsay , MD [facility] [address] [place], [place] [id number] [phone] (office)

## 2018-02-23 NOTE — Anesthesia Procedure Notes (Signed)
Procedure Name: Intubation Date/Time: 02/23/2018 9:27 AM Performed by: Charmaine Downs, CRNA Pre-anesthesia Checklist: Patient identified, Patient being monitored, Timeout performed, Emergency Drugs available and Suction available Patient Re-evaluated:Patient Re-evaluated prior to induction Oxygen Delivery Method: Circle System Utilized Preoxygenation: Pre-oxygenation with 100% oxygen Induction Type: IV induction Ventilation: Mask ventilation without difficulty Laryngoscope Size: Mac and 4 Grade View: Grade II Tube type: Oral Tube size: 8.0 mm Number of attempts: 1 Airway Equipment and Method: stylet Placement Confirmation: ETT inserted through vocal cords under direct vision,  positive ETCO2 and breath sounds checked- equal and bilateral Secured at: 22 cm Tube secured with: Tape Dental Injury: Teeth and Oropharynx as per pre-operative assessment

## 2018-02-23 NOTE — Interval H&P Note (Signed)
History and Physical Interval Note:  02/23/2018 8:48 AM  Christopher Hampton  has presented today for surgery, with the diagnosis of sludge  The various methods of treatment have been discussed with the patient and family. After consideration of risks, benefits and other options for treatment, the patient has consented to  Procedure(s): LAPAROSCOPIC CHOLECYSTECTOMY (N/A) as a surgical intervention .  The patient's history has been reviewed, patient examined, no change in status, stable for surgery.  I have reviewed the patient's chart and labs.  Questions were answered to the patient's satisfaction.    No questions.  Lucretia RoersLindsay C Bridges

## 2018-02-23 NOTE — Anesthesia Postprocedure Evaluation (Signed)
Anesthesia Post Note  Patient: Christopher Hampton  Procedure(s) Performed: LAPAROSCOPIC CHOLECYSTECTOMY (N/A )  Patient location during evaluation: PACU Anesthesia Type: General Level of consciousness: awake and alert and patient cooperative Pain management: pain level controlled Vital Signs Assessment: post-procedure vital signs reviewed and stable Respiratory status: spontaneous breathing, nonlabored ventilation and respiratory function stable Cardiovascular status: blood pressure returned to baseline Postop Assessment: no apparent nausea or vomiting Anesthetic complications: no     Last Vitals:  Vitals:   02/23/18 1055 02/23/18 1105  BP:  108/71  Pulse: 92 87  Resp: 14 15  Temp:  36.8 C  SpO2: 95% 94%    Last Pain:  Vitals:   02/23/18 1105  TempSrc: Oral  PainSc: 4                  Nalayah Hitt J

## 2018-02-23 NOTE — Discharge Instructions (Signed)
Discharge Laparoscopic Surgery Instructions:  Common Complaints: Right shoulder pain is common after laparoscopic surgery. This is secondary to the gas used in the surgery being trapped under the diaphragm.  Walk to help your body absorb the gas. This will improve in a few days. Pain at the port sites are common, especially the larger port sites. This will improve with time.  Some nausea is common and poor appetite. The main goal is to stay hydrated the first few days after surgery.   Diet/ Activity: Diet as tolerated. You may not have an appetite, but it is important to stay hydrated. Drink 64 ounces of water a day. Your appetite will return with time.  Shower per your regular routine daily.  Do not take hot showers. Take warm showers that are less than 10 minutes. Rest and listen to your body, but do not remain in bed all day.  Walk everyday for at least 15-20 minutes. Deep cough and move around every 1-2 hours in the first few days after surgery.  Do not lift > 10 lbs, perform excessive bending, pushing, pulling, squatting for 1-2 weeks after surgery.  Do not pick at the dermabond glue on your incision sites.  This glue film will remain in place for 1-2 weeks and will start to peel off.  Do not place lotions or balms on your incision unless instructed to specifically by Dr. Henreitta Leber.   Medication: Take tylenol and ibuprofen as needed for pain control, alternating every 4-6 hours.  Example:  Tylenol 1000mg  @ 6am, 12noon, 6pm, (Do not exceed 4000mg  of tylenol a day). Ibuprofen 800mg  @ 9am, 3pm, 9pm, 3am (Do not exceed 3600mg  of ibuprofen a day).  Take Roxicodone for breakthrough pain every 4 hours.  Take Colace for constipation related to narcotic pain medication. If you do not have a bowel movement in 2 days, take Miralax over the counter.  Drink plenty of water to also prevent constipation.   Contact Information: If you have questions or concerns, please call our office,  [phone], Monday- Thursday 8AM-5PM and Friday 8AM-12Noon.  If it is after hours or on the weekend, please call [facility], [phone], and ask to speak to the surgeon on call for Dr. Henreitta Leber at [facility].    General Anesthesia, Adult General anesthesia is the use of medicines to make a person "go to sleep" (unconscious) for a medical procedure. General anesthesia must be used for certain procedures, and is often recommended for procedures that:  Last a long time.  Require you to be still or in an unusual position.  Are major and can cause blood loss. The medicines used for general anesthesia are called general anesthetics. As well as making you unconscious for a certain amount of time, these medicines:  Prevent pain.  Control your blood pressure.  Relax your muscles. Tell a health care provider about:  Any allergies you have.  All medicines you are taking, including vitamins, herbs, eye drops, creams, and over-the-counter medicines.  Any problems you or family members have had with anesthetic medicines.  Types of anesthetics you have had in the past.  Any blood disorders you have.  Any surgeries you have had.  Any medical conditions you have.  Any recent upper respiratory, chest, or ear infections.  Any history of: ? Heart or lung conditions, such as heart failure, sleep apnea, asthma, or chronic obstructive pulmonary disease (COPD). ? Financial planner. ? Depression or anxiety.  Any tobacco or drug use, including marijuana or alcohol  use.  Whether you are pregnant or may be pregnant. What are the risks? Generally, this is a safe procedure. However, problems may occur, including:  Allergic reaction.  Lung and heart problems.  Inhaling food or liquid from the stomach into the lungs (aspiration).  Nerve injury.  Dental injury.  Air in the bloodstream, which can lead to stroke.  Extreme agitation or confusion (delirium) when you wake up from the  anesthetic.  Waking up during your procedure and being unable to move. This is rare. These problems are more likely to develop if you are having a major surgery or if you have an advanced or serious medical condition. You can prevent some of these complications by answering all of your health care provider's questions thoroughly and by following all instructions before your procedure. General anesthesia can cause side effects, including:  Nausea or vomiting.  A sore throat from the breathing tube.  Hoarseness.  Wheezing or coughing.  Shaking chills.  Tiredness.  Body aches.  Anxiety.  Sleepiness or drowsiness.  Confusion or agitation. What happens before the procedure? Staying hydrated Follow instructions from your health care provider about hydration, which may include:  Up to 2 hours before the procedure - you may continue to drink clear liquids, such as water, clear fruit juice, black coffee, and plain tea.  Eating and drinking restrictions Follow instructions from your health care provider about eating and drinking, which may include:  8 hours before the procedure - stop eating heavy meals or foods such as meat, fried foods, or fatty foods.  6 hours before the procedure - stop eating light meals or foods, such as toast or cereal.  6 hours before the procedure - stop drinking milk or drinks that contain milk.  2 hours before the procedure - stop drinking clear liquids. Medicines Ask your health care provider about:  Changing or stopping your regular medicines. This is especially important if you are taking diabetes medicines or blood thinners.  Taking medicines such as aspirin and ibuprofen. These medicines can thin your blood. Do not take these medicines unless your health care provider tells you to take them.  Taking over-the-counter medicines, vitamins, herbs, and supplements. Do not take these during the week before your procedure unless your health care  provider approves them. General instructions  Starting 3-6 weeks before the procedure, do not use any products that contain nicotine or tobacco, such as cigarettes and e-cigarettes. If you need help quitting, ask your health care provider.  If you brush your teeth on the morning of the procedure, make sure to spit out all of the toothpaste.  Tell your health care provider if you become ill or develop a cold, cough, or fever.  If instructed by your health care provider, bring your sleep apnea device with you on the day of your surgery (if applicable).  Ask your health care provider if you will be going home the same day, the following day, or after a longer hospital stay. ? Plan to have someone take you home from the hospital or clinic. ? Plan to have a responsible adult care for you for at least 24 hours after you leave the hospital or clinic. This is important. What happens during the procedure?   You will be given anesthetics through both of the following: ? A mask placed over your nose and mouth. ? An IV in one of your veins.  You may receive a medicine to help you relax (sedative).  After you are unconscious,  a breathing tube may be inserted down your throat to help you breathe. This will be removed before you wake up.  An anesthesia specialist will stay with you throughout your procedure. He or she will: ? Keep you comfortable and safe by continuing to give you medicines and adjusting the amount of medicine that you get. ? Monitor your blood pressure, pulse, and oxygen levels to make sure that the anesthetics do not cause any problems. The procedure may vary among health care providers and hospitals. What happens after the procedure?  Your blood pressure, temperature, heart rate, breathing rate, and blood oxygen level will be monitored until the medicines you were given have worn off.  You will wake up in a recovery area. You may wake up slowly.  If you feel anxious or  agitated, you may be given medicine to help you calm down.  If you will be going home the same day, your health care provider may check to make sure you can walk, drink, and urinate.  Your health care provider will treat any pain or side effects you have before you go home.  Do not drive for 24 hours if you were given a sedative. Summary  General anesthesia is used to keep you still and prevent pain during a procedure.  It is important to tell your health care provider about your medical history and any surgeries you have had, and previous experience with anesthesia.  Follow your health care provider's instructions about when to stop eating, drinking, or taking certain medicines before your procedure.  Plan to have someone take you home from the hospital or clinic. This information is not intended to replace advice given to you by your health care provider. Make sure you discuss any questions you have with your health care provider. Document Released: 05/21/2007 Document Revised: 07/01/2017 Document Reviewed: 09/27/2016 Elsevier Interactive Patient Education  2019 [employer]  Laparoscopic Cholecystectomy, Care After This sheet gives you information about how to care for yourself after your procedure. Your doctor may also give you more specific instructions. If you have problems or questions, contact your doctor. Follow these instructions at home: Care for cuts from surgery (incisions)   Follow instructions from your doctor about how to take care of your cuts from surgery. Make sure you: ? Wash your hands with soap and water before you change your bandage (dressing). If you cannot use soap and water, use hand sanitizer. ? Change your bandage as told by your doctor. ? Leave stitches (sutures), skin glue, or skin tape (adhesive) strips in place. They may need to stay in place for 2 weeks or longer. If tape strips get loose and curl up, you may trim the loose edges. Do not remove tape  strips completely unless your doctor says it is okay.  Do not take baths, swim, or use a hot tub until your doctor says it is okay. Ask your doctor if you can take showers. You may only be allowed to take sponge baths for bathing.  Check your surgical cut area every day for signs of infection. Check for: ? More redness, swelling, or pain. ? More fluid or blood. ? Warmth. ? Pus or a bad smell. Activity  Do not drive or use heavy machinery while taking prescription pain medicine.  Do not lift anything that is heavier than 10 lb (4.5 kg) until your doctor says it is okay.  Do not play contact sports until your doctor says it is okay.  Do not drive  for 24 hours if you were given a medicine to help you relax (sedative).  Rest as needed. Do not return to work or school until your doctor says it is okay. General instructions  Take over-the-counter and prescription medicines only as told by your doctor.  To prevent or treat constipation while you are taking prescription pain medicine, your doctor may recommend that you: ? Drink enough fluid to keep your pee (urine) clear or pale yellow. ? Take over-the-counter or prescription medicines. ? Eat foods that are high in fiber, such as fresh fruits and vegetables, whole grains, and beans. ? Limit foods that are high in fat and processed sugars, such as fried and sweet foods. Contact a doctor if:  You develop a rash.  You have more redness, swelling, or pain around your surgical cuts.  You have more fluid or blood coming from your surgical cuts.  Your surgical cuts feel warm to the touch.  You have pus or a bad smell coming from your surgical cuts.  You have a fever.  One or more of your surgical cuts breaks open. Get help right away if:  You have trouble breathing.  You have chest pain.  You have pain that is getting worse in your shoulders.  You faint or feel dizzy when you stand.  You have very bad pain in your belly  (abdomen).  You are sick to your stomach (nauseous) for more than one day.  You have throwing up (vomiting) that lasts for more than one day.  You have leg pain. This information is not intended to replace advice given to you by your health care provider. Make sure you discuss any questions you have with your health care provider. Document Released: 11/21/2007 Document Revised: 09/02/2015 Document Reviewed: 07/31/2015 Elsevier Interactive Patient Education  2019 [employer].

## 2018-02-23 NOTE — Transfer of Care (Signed)
Immediate Anesthesia Transfer of Care Note  Patient: Christopher Hampton  Procedure(s) Performed: LAPAROSCOPIC CHOLECYSTECTOMY (N/A )  Patient Location: PACU  Anesthesia Type:General  Level of Consciousness: awake and patient cooperative  Airway & Oxygen Therapy: Patient Spontanous Breathing and Patient connected to nasal cannula oxygen  Post-op Assessment: Report given to RN, Post -op Vital signs reviewed and stable and Patient moving all extremities  Post vital signs: Reviewed and stable  Last Vitals:  Vitals Value Taken Time  BP    Temp    Pulse    Resp    SpO2      Last Pain:  Vitals:   02/23/18 0838  TempSrc: Oral  PainSc: 0-No pain      Patients Stated Pain Goal: 8 (02/23/18 [id number])  Complications: No apparent anesthesia complications

## 2018-02-23 NOTE — Anesthesia Preprocedure Evaluation (Signed)
Anesthesia Evaluation  Patient identified by MRN, date of birth, ID band Patient awake    Reviewed: Allergy & Precautions, H&P , NPO status , Patient's Chart, lab work & pertinent test results  Airway Mallampati: I  TM Distance: >3 FB Neck ROM: full    Dental  (+) Loose, Dental Advisory Given,    Pulmonary neg pulmonary ROS, former smoker,    Pulmonary exam normal breath sounds clear to auscultation       Cardiovascular Exercise Tolerance: Good hypertension,  Rhythm:regular Rate:Normal     Neuro/Psych negative neurological ROS  negative psych ROS   GI/Hepatic Neg liver ROS, GERD  ,  Endo/Other  negative endocrine ROSdiabetes, Type 2  Renal/GU negative Renal ROS  negative genitourinary   Musculoskeletal   Abdominal   Peds  Hematology negative hematology ROS (+)   Anesthesia Other Findings   Reproductive/Obstetrics negative OB ROS                             Anesthesia Physical Anesthesia Plan  ASA: II  Anesthesia Plan: General   Post-op Pain Management:    Induction:   PONV Risk Score and Plan:   Airway Management Planned:   Additional Equipment:   Intra-op Plan:   Post-operative Plan:   Informed Consent: I have reviewed the patients History and Physical, chart, labs and discussed the procedure including the risks, benefits and alternatives for the proposed anesthesia with the patient or authorized representative who has indicated his/her understanding and acceptance.   Dental Advisory Given  Plan Discussed with: CRNA  Anesthesia Plan Comments:         Anesthesia Quick Evaluation

## 2018-02-24 ENCOUNTER — Encounter (HOSPITAL_COMMUNITY): Payer: Self-pay | Admitting: General Surgery

## 2018-02-24 NOTE — Addendum Note (Signed)
Addendum  created 02/24/18 [id number] by Franco NonesYates, Quintavia Rogstad S, CRNA   Intraprocedure Flowsheets edited

## 2018-03-10 ENCOUNTER — Encounter: Payer: Self-pay | Admitting: General Surgery

## 2018-03-10 ENCOUNTER — Ambulatory Visit (INDEPENDENT_AMBULATORY_CARE_PROVIDER_SITE_OTHER): Payer: Self-pay | Admitting: General Surgery

## 2018-03-10 VITALS — BP 131/77 | HR 88 | Temp 98.2°F | Resp 18 | Wt 204.8 lb

## 2018-03-10 DIAGNOSIS — K828 Other specified diseases of gallbladder: Secondary | ICD-10-CM

## 2018-03-10 NOTE — Progress Notes (Signed)
[facility] Note   HPI:  48 y.o. Male presents to clinic for post-op follow-up after a laparoscopic cholecystectomy. Patient reports he is doing well. He is feeling better each day. Some pain at the epigastric site with deep breathing.   Review of Systems:  No fever or chills Tolerating diet Normal BMs  All other review of systems: otherwise negative   Pathology: Diagnosis Gallbladder - CHRONIC CHOLECYSTITIS. - REACTIVE LYMPH NODE.  Vital Signs:  BP 131/77 (BP Location: Left Arm, Patient Position: Sitting, Cuff Size: Normal)   Pulse 88   Temp 98.2 F (36.8 C) (Temporal)   Resp 18   Wt 204 lb 12.8 oz (92.9 kg)   BMI 29.39 kg/m    Physical Exam:  Physical Exam Vitals signs reviewed.  Cardiovascular:     Rate and Rhythm: Normal rate.  Pulmonary:     Effort: Pulmonary effort is normal.  Abdominal:     General: There is no distension.     Palpations: Abdomen is soft.     Tenderness: There is no abdominal tenderness.     Comments: Port site healing, no erythema or drainage, some mild induration  Neurological:     Mental Status: He is alert.     Assessment:  48 y.o. yo Male s/p a laparoscopic cholecystectomy for cholecystitis. Doing well.  Plan:  - PRN follow up   - Activity and diet as tolerated  - Return to work 03/16/2018    All of the above recommendations were discussed with the patient, and all of patient's questions were answered to his expressed satisfaction.  Algis Greenhouse, MD [facility] [address] [place], [place] [address] [phone] (office)

## 2018-03-10 NOTE — Patient Instructions (Signed)
Activity and diet as tolerated.    

## 2018-10-05 ENCOUNTER — Other Ambulatory Visit: Payer: Self-pay | Admitting: Adult Health Nurse Practitioner

## 2018-10-05 ENCOUNTER — Other Ambulatory Visit (HOSPITAL_COMMUNITY): Payer: Self-pay | Admitting: Adult Health Nurse Practitioner

## 2018-10-05 DIAGNOSIS — M545 Low back pain, unspecified: Secondary | ICD-10-CM

## 2018-10-14 ENCOUNTER — Ambulatory Visit (HOSPITAL_COMMUNITY)
Admission: RE | Admit: 2018-10-14 | Discharge: 2018-10-14 | Disposition: A | Discharge status: Home / Self Care | Payer: PRIVATE HEALTH INSURANCE | Source: Ambulatory Visit | Attending: Adult Health Nurse Practitioner | Admitting: Adult Health Nurse Practitioner

## 2018-10-14 ENCOUNTER — Other Ambulatory Visit: Payer: Self-pay

## 2018-10-14 DIAGNOSIS — M545 Low back pain, unspecified: Secondary | ICD-10-CM

## 2018-12-02 ENCOUNTER — Other Ambulatory Visit: Payer: Self-pay

## 2018-12-02 ENCOUNTER — Encounter (HOSPITAL_COMMUNITY): Payer: Self-pay | Admitting: Physical Therapy

## 2018-12-02 ENCOUNTER — Ambulatory Visit (HOSPITAL_COMMUNITY): Payer: PRIVATE HEALTH INSURANCE | Attending: Neurosurgery | Admitting: Physical Therapy

## 2018-12-02 DIAGNOSIS — M5416 Radiculopathy, lumbar region: Secondary | ICD-10-CM

## 2018-12-02 DIAGNOSIS — M6281 Muscle weakness (generalized): Secondary | ICD-10-CM | POA: Insufficient documentation

## 2018-12-02 DIAGNOSIS — R2689 Other abnormalities of gait and mobility: Secondary | ICD-10-CM | POA: Diagnosis present

## 2018-12-02 NOTE — Therapy (Signed)
[facility] [address] [place], [place], [address] Phone: [phone]   Fax:  [phone]  Physical Therapy Evaluation  Patient Details  Name: Christopher Hampton MRN: [id number] Date of Birth: Jun 21, 1970 Referring Provider (PT): Ophelia Charter   Encounter Date: 12/02/2018  PT End of Session - 12/02/18 1730    Visit Number  1    Number of Visits  8    Date for PT Re-Evaluation  12/30/18    Authorization Type  [employer]    Authorization Time Period  12/02/18 to 12/30/18    Authorization - Visit Number  1    Authorization - Number of Visits  8    PT Start Time  1600    PT Stop Time  1645    PT Time Calculation (min)  45 min    Activity Tolerance  Patient limited by pain    Behavior During Therapy  [facility] for tasks assessed/performed       Past Medical History:  Diagnosis Date  . Diabetes mellitus without complication ([place])   . GERD (gastroesophageal reflux disease)     Past Surgical History:  Procedure Laterality Date  . BACK SURGERY    . CHOLECYSTECTOMY N/A 02/23/2018   Procedure: LAPAROSCOPIC CHOLECYSTECTOMY;  Surgeon: Virl Cagey, MD;  Location: [facility];  Service: General;  Laterality: N/A;    There were no vitals filed for this visit.   Subjective Assessment - 12/02/18 1707    Subjective  Patient presents to physical therapy with complaint of chronic low back pain with radiculopathy. Patient reports pain is chronic in nature and began in 2006. Patient says pain began insidiously but has worsened over the course of the years. Patient reports having physical therapy for his back in 2007 but says it was not helpful. 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 Patient says he was scheduled for back surgery but was instructed to undergo conservative therapy first. Patient reports occasional use of biofreeze for pain, in addition to prescribed medication but notes minimal effect. Patient says he has noticed  increased frequency of urination, especially at night, but denies incontinence or saddle paresthesia. Patient states this began insidiously around June/ July. 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    How long can you sit comfortably?  Unable (2/10 pain seated)    How long can you stand comfortably?  Unable (3/10 pain standing)    How long can you walk comfortably?  Able to walk up to 100 feet in clinic but with pain    Diagnostic tests  MRI    Patient Stated Goals  Get my back fixed    Currently in Pain?  Yes    Pain Score  2    8/10 at worst with activity   Pain Location  Back    Pain Orientation  Posterior;Left    Pain Descriptors / Indicators  Numbness;Shooting;Stabbing;Jabbing;Radiating    Pain Type  Chronic pain    Pain Radiating Towards  Left posterior thigh    Pain Onset  Other (comment)   2006   Pain Frequency  Constant    Aggravating Factors   Bending ford, twisting, lifting    Pain Relieving Factors  bending forward, lifting, twisting    Effect of Pain on Daily Activities  Limiting    Multiple Pain Sites  No         [facility]  PT Assessment - 12/02/18 0001      Assessment   Medical Diagnosis  Lumbar radiculopathy (Left)    Referring Provider (PT)  Cristi LoronJeffrey D. Jenkins    Onset Date/Surgical Date  --   chronic   Next MD Visit  not scheduled    Prior Therapy  yes      Precautions   Precautions  None      Restrictions   Weight Bearing Restrictions  No      Prior Function   Level of Independence  Independent    Vocation  Other (comment)   Stopped working in June/July     Cognition   Overall Cognitive Status  Within Functional Limits for tasks assessed      Observation/Other Assessments   Focus on Therapeutic Outcomes (FOTO)   70% limited      Sensation   Light Touch  Appears Intact      ROM / Strength   AROM / PROM / Strength  AROM;Strength      AROM   AROM Assessment Site   Lumbar    Lumbar Flexion  Mod restriction    Increased lumbar pain    Lumbar Extension  Max restriction     Lumbar - Right Side Bend  Mod restriction     Lumbar - Left Side Bend  Mod restrict   Increased lumbar pain    Lumbar - Right Rotation  Min restrtict    Lumbar - Left Rotation  Min restrict   Increased lumbar pain     Strength   Strength Assessment Site  Hip;Knee;Ankle    Right/Left Hip  Right;Left    Right Hip Flexion  3+/5    Right Hip Extension  --   Unable to test due to pain in testing position    Right Hip ABduction  3+/5    Left Hip Flexion  3+/5    Left Hip Extension  --   Unable to assume testing position due to pain   Left Hip ABduction  3-/5    Right/Left Knee  Right;Left    Right Knee Flexion  4/5    Right Knee Extension  4/5    Left Knee Flexion  4-/5    Left Knee Extension  4-/5    Right/Left Ankle  Right;Left    Right Ankle Dorsiflexion  4+/5    Left Ankle Dorsiflexion  4/5      Palpation   Palpation comment  Mod tenderness to palpation about LT lumbar paraspinalls, LT gluteals      Special Tests    Special Tests  Lumbar    Lumbar Tests  Slump Test      Slump test   Findings  Negative    Side  Left      Transfers   Transfers  Sit to Stand    Sit to Stand  6: Modified independent (Device/Increase time);With upper extremity assist      Ambulation/Gait   Ambulation/Gait  Yes    Ambulation/Gait Assistance  7: Independent    Ambulation Distance (Feet)  --   100 feet   Assistive device  None    Gait Pattern  Antalgic;Poor foot clearance - left;Decreased weight shift to left;Decreased stance time - left    Ambulation Surface  Level                Objective measurements completed on examination: See above findings.              PT Education -  12/02/18 1743    Education Details  Patient educated on therapy diagnosis and plan of care    Person(s) Educated  Patient    Methods  Explanation    Comprehension  Verbalized  understanding       PT Short Term Goals - 12/02/18 1749      PT SHORT TERM GOAL #1   Title  Patient will be able to transfer from chair (sit to stand) without use of hands or increased pain, to improve functional mobility    Baseline  sit to stand Mod I, use of both hands    Time  2    Period  Weeks    Status  New    Target Date  12/16/18      PT SHORT TERM GOAL #2   Title  Patient will be able to perform bed mobility (supine to sidelying, supine to sit) with no increased pain, to return to sleeping in bed    Baseline  Current pain with bed mobility    Time  2    Period  Weeks    Status  New    Target Date  12/16/18      PT SHORT TERM GOAL #3   Title  Patient will be able to ambulate community distance (>150 feet) with even weight bearing between RT and LT LE.    Baseline  Antlagic gait, decreased WB on LLE    Time  2    Period  Weeks    Status  New    Target Date  12/16/18        PT Long Term Goals - 12/02/18 1753      PT LONG TERM GOAL #1   Title  Will improve BLE MMT by 1/2 grade to improve functional mobility and transfers    Time  4    Period  Weeks    Status  New    Target Date  12/30/18      PT LONG TERM GOAL #2   Title  Patient will be able to stand >20 min with no increased pain to improve ability to perform ADLs such as cooking    Baseline  unable to stand without pain    Time  4    Period  Weeks    Status  New    Target Date  12/30/18             Plan - 12/02/18 1743    Clinical Impression Statement  Patient presents to physical therapy with complaint of low back pain with LT sided radiculopathy. Patient was significantly limited in todays assessment by pain. Patient demos significant deficits in strength, ROM, and point tenderness about lumbar paraspinals and LT gluteal musculature, which are likely contributing to pain and are negatively impacting ability to perform ADLs and functional mobility tasks. Patient will benefit from skilled physical  therapy to address these deficits to reduce pain and improve LOF with ADLs and functional mobility tasks.    Personal Factors and Comorbidities  Time since onset of injury/illness/exacerbation;Comorbidity 1    Comorbidities  Diabetes    Examination-Activity Limitations  Bathing;Lift;Stand;Bed Mobility;Bend;Locomotion Level;Dressing;Squat;Sleep;Stairs;Transfers    Examination-Participation Restrictions  Community Activity;Yard Work;Other    Stability/Clinical Decision Making  Evolving/Moderate complexity    Clinical Decision Making  Moderate    Rehab Potential  Fair    PT Frequency  2x / week    PT Duration  4 weeks    PT Treatment/Interventions  ADLs/Self Care Home Management;Biofeedback;Electrical Stimulation;Cryotherapy;Traction;Moist  Heat;Functional mobility training;Therapeutic activities;Therapeutic exercise;Manual techniques;Passive range of motion;Spinal Manipulations;Joint Manipulations;Neuromuscular re-education;Gait training;DME Instruction;Stair training;Balance training;Patient/family education;Dry needling;Energy conservation;Taping    PT Next Visit Plan  Focus on pain amanagement/ reduction, try manual therapy STM to L lumbar paraspinals if patient can tolerate sidelying, initiate core strengthening, begin with isometrics and progress as tolerated    PT Home Exercise Plan  Initiate at first session    Consulted and Agree with Plan of Care  Patient       Patient will benefit from skilled therapeutic intervention in order to improve the following deficits and impairments:  Abnormal gait, Impaired sensation, Improper body mechanics, Pain, Difficulty walking, Decreased range of motion, Decreased strength, Decreased activity tolerance, Decreased mobility  Visit Diagnosis: Radiculopathy, lumbar region  Muscle weakness (generalized)  Other abnormalities of gait and mobility     Problem List Patient Active Problem List   Diagnosis Date Noted  . Calculus of gallbladder without  cholecystitis without obstruction   . Sludge in gallbladder 02/12/2018   Verne Carrow PT, DPT 6:25 PM, 12/02/18 [phone]   [facility] [address] [place], [place], [address] Phone: [phone]   Fax:  [phone]  Name: Christopher Hampton MRN: [id number] Date of Birth: [birth year]

## 2018-12-04 ENCOUNTER — Encounter (HOSPITAL_COMMUNITY): Payer: Self-pay | Admitting: Physical Therapy

## 2018-12-04 ENCOUNTER — Ambulatory Visit (HOSPITAL_COMMUNITY): Payer: PRIVATE HEALTH INSURANCE | Admitting: Physical Therapy

## 2018-12-04 ENCOUNTER — Other Ambulatory Visit: Payer: Self-pay

## 2018-12-04 DIAGNOSIS — M5416 Radiculopathy, lumbar region: Secondary | ICD-10-CM | POA: Diagnosis not present

## 2018-12-04 DIAGNOSIS — R2689 Other abnormalities of gait and mobility: Secondary | ICD-10-CM

## 2018-12-04 DIAGNOSIS — M6281 Muscle weakness (generalized): Secondary | ICD-10-CM

## 2018-12-04 NOTE — Therapy (Signed)
[facility] [address] [place], [place], [address] Phone: [phone]   Fax:  [phone]  Physical Therapy Treatment  Patient Details  Name: Christopher Hampton MRN: [id number] Date of Birth: 1971-01-23 Referring Provider (PT): Cristi LoronJeffrey D. Jenkins   Encounter Date: 12/04/2018  PT End of Session - 12/04/18 1536    Visit Number  2    Number of Visits  8    Date for PT Re-Evaluation  12/30/18    Authorization Type  [employer]    Authorization Time Period  12/02/18 to 12/30/18    Authorization - Visit Number  2    Authorization - Number of Visits  8    PT Start Time  1430    PT Stop Time  1510    PT Time Calculation (min)  40 min    Activity Tolerance  Patient limited by pain    Behavior During Therapy  [facility] for tasks assessed/performed       Past Medical History:  Diagnosis Date  . Diabetes mellitus without complication ([facility])   . GERD (gastroesophageal reflux disease)     Past Surgical History:  Procedure Laterality Date  . BACK SURGERY    . CHOLECYSTECTOMY N/A 02/23/2018   Procedure: LAPAROSCOPIC CHOLECYSTECTOMY;  Surgeon: Lucretia RoersBridges, Lindsay C, MD;  Location: [facility];  Service: General;  Laterality: N/A;    There were no vitals filed for this visit.  Subjective Assessment - 12/04/18 1432    Subjective  Patient says that he worked on some lumbar extensions in standing last night, and that he did not have pain during activity. Patient says that he had trouble sleeping last night and could not get comfortable. Patient says that he was in pain this morning and was having a hard time walking. Patient says he has taken a pain pill this morning and put on a salon pas for pain.    How long can you sit comfortably?  Unable (2/10 pain seated)    How long can you stand comfortably?  Unable (3/10 pain standing)    How long can you walk comfortably?  Able to walk up to 100 feet in clinic but with pain    Diagnostic tests  MRI    Patient Stated Goals   Get my back fixed    Currently in Pain?  Yes    Pain Score  3     Pain Location  Back    Pain Orientation  Left;Posterior    Pain Descriptors / Indicators  Cramping;Aching;Shooting    Pain Onset  Other (comment)   2006                      [facility] Adult PT Treatment/Exercise - 12/04/18 0001      Exercises   Exercises  Lumbar      Lumbar Exercises: Stretches   Lower Trunk Rotation  5 reps    Lower Trunk Rotation Limitations  --   limited range due to pain    Pelvic Tilt  10 reps;5 seconds      Lumbar Exercises: Supine   Other Supine Lumbar Exercises  Isometric hip abd with belt; 10 x 5"      Manual Therapy   Manual Therapy  Soft tissue mobilization    Manual therapy comments  All manual perfromed separate from other exercise    Soft tissue mobilization  STM to LT lumbar paraspinals, gluteals in sidelying; manual muscle release to LT psoas; STM to LT  lateral thigh in hooklying   STM with green ball            PT Education - 12/04/18 1535    Education Details  Patient educated on adding of supine pelvic tilts and isometric hip abd to HEP    Person(s) Educated  Patient    Methods  Explanation    Comprehension  Verbalized understanding;Returned demonstration;Verbal cues required       PT Short Term Goals - 12/04/18 1544      PT SHORT TERM GOAL #1   Title  Patient will be able to transfer from chair (sit to stand) without use of hands or increased pain, to improve functional mobility    Baseline  sit to stand Mod I, use of both hands    Time  2    Period  Weeks    Status  On-going    Target Date  12/16/18      PT SHORT TERM GOAL #2   Title  Patient will be able to perform bed mobility (supine to sidelying, supine to sit) with no increased pain, to return to sleeping in bed    Baseline  Current pain with bed mobility    Time  2    Period  Weeks    Status  On-going    Target Date  12/16/18      PT SHORT TERM GOAL #3   Title  Patient will be able  to ambulate community distance (>150 feet) with even weight bearing between RT and LT LE.    Baseline  Antlagic gait, decreased WB on LLE    Time  2    Period  Weeks    Status  On-going    Target Date  12/16/18        PT Long Term Goals - 12/04/18 1544      PT LONG TERM GOAL #1   Title  Will improve BLE MMT by 1/2 grade to improve functional mobility and transfers    Time  4    Period  Weeks    Status  On-going      PT LONG TERM GOAL #2   Title  Patient will be able to stand >20 min with no increased pain to improve ability to perform ADLs such as cooking    Baseline  unable to stand without pain    Time  4    Period  Weeks    Status  On-going            Plan - 12/04/18 1537    Clinical Impression Statement  Patient continues to be sensitive to treatment. Session initiated with manual STM to LT lumbar paraspinals and and LT gluteal area to reduce point tenderness. Patient demos signifianct guarding even with minimal pressure applied during manuals. Patient noted radicular pain into LT thoracic area as well as into LT posterior thigh during manual treatment. Attempted manual STM to lateral LT thigh in supine position in attempt to desensitize, but with minimal effect. Trialed manual muscle release of LT psoas in supine to improve LT hip mobility with no effect on pain or ROM. Patient apprehensive to ther ex, and reports increased pain with most ther ex. Patient was able to tolerated supine pelvic tilts with no increased pain, but noted disomcofrt returning to neutral position. Patient educated on log roll technique to reduce lumbar strain with bed mobility, but patient reported increased radicular pain into LT LE with log rolling to LT.    Personal  Factors and Comorbidities  Time since onset of injury/illness/exacerbation;Comorbidity 1    Comorbidities  Diabetes    Examination-Activity Limitations  Bathing;Lift;Stand;Bed Mobility;Bend;Locomotion  Level;Dressing;Squat;Sleep;Stairs;Transfers    Examination-Participation Restrictions  Community Activity;Yard Work;Other    Stability/Clinical Decision Making  Evolving/Moderate complexity    Rehab Potential  Fair    PT Frequency  2x / week    PT Duration  4 weeks    PT Treatment/Interventions  ADLs/Self Care Home Management;Biofeedback;Electrical Stimulation;Cryotherapy;Traction;Moist Heat;Functional mobility training;Therapeutic activities;Therapeutic exercise;Manual techniques;Passive range of motion;Spinal Manipulations;Joint Manipulations;Neuromuscular re-education;Gait training;DME Instruction;Stair training;Balance training;Patient/family education;Dry needling;Energy conservation;Taping    PT Next Visit Plan  Continue to address muscle restrictions with manual treatment in attempt to improve mobility and reduce pain and gaurding. Try seated sciatic nerve glides for LLE. Add isometric core strengthening as tolerated.    PT Home Exercise Plan  12/04/18- supine pelvic tilts, isometric hip abd with belt    Consulted and Agree with Plan of Care  Patient       Patient will benefit from skilled therapeutic intervention in order to improve the following deficits and impairments:  Abnormal gait, Impaired sensation, Improper body mechanics, Pain, Difficulty walking, Decreased range of motion, Decreased strength, Decreased activity tolerance, Decreased mobility  Visit Diagnosis: Radiculopathy, lumbar region  Muscle weakness (generalized)  Other abnormalities of gait and mobility     Problem List Patient Active Problem List   Diagnosis Date Noted  . Calculus of gallbladder without cholecystitis without obstruction   . Sludge in gallbladder 02/12/2018    Elizbeth Squires PT DPT 12/04/2018, 3:58 PM  [facility] [place], [place], [address] Phone: [phone]   Fax:  [phone]  Name: Christopher Hampton MRN: [id number] Date of  Birth: 07-12-1970

## 2018-12-11 ENCOUNTER — Ambulatory Visit (HOSPITAL_COMMUNITY): Payer: PRIVATE HEALTH INSURANCE | Admitting: Physical Therapy

## 2018-12-11 ENCOUNTER — Encounter (HOSPITAL_COMMUNITY): Payer: Self-pay | Admitting: Physical Therapy

## 2018-12-11 ENCOUNTER — Other Ambulatory Visit: Payer: Self-pay

## 2018-12-11 DIAGNOSIS — M5416 Radiculopathy, lumbar region: Secondary | ICD-10-CM | POA: Diagnosis not present

## 2018-12-11 DIAGNOSIS — M6281 Muscle weakness (generalized): Secondary | ICD-10-CM

## 2018-12-11 DIAGNOSIS — R2689 Other abnormalities of gait and mobility: Secondary | ICD-10-CM

## 2018-12-11 NOTE — Therapy (Signed)
[facility] [address] [place], [place], [address] Phone: [phone]   Fax:  [phone]  Physical Therapy Treatment  Patient Details  Name: Christopher Hampton MRN: [id number] Date of Birth: 1970-11-11 Referring Provider (PT): Cristi Loron   Encounter Date: 12/11/2018  PT End of Session - 12/11/18 1649    Visit Number  3    Number of Visits  8    Date for PT Re-Evaluation  12/30/18    Authorization Type  [employer]    Authorization Time Period  12/02/18 to 12/30/18    Authorization - Visit Number  3    Authorization - Number of Visits  8    PT Start Time  1605    PT Stop Time  1645    PT Time Calculation (min)  40 min    Activity Tolerance  Patient limited by pain    Behavior During Therapy  [facility] for tasks assessed/performed       Past Medical History:  Diagnosis Date  . Diabetes mellitus without complication (HCC)   . GERD (gastroesophageal reflux disease)     Past Surgical History:  Procedure Laterality Date  . BACK SURGERY    . CHOLECYSTECTOMY N/A 02/23/2018   Procedure: LAPAROSCOPIC CHOLECYSTECTOMY;  Surgeon: Lucretia Roers, MD;  Location: [facility];  Service: General;  Laterality: N/A;    There were no vitals filed for this visit.  Subjective Assessment - 12/11/18 1606    Subjective  Patient says he tried doing some of his HEP, but it hurt so he stopped. Patient says he feels that his back "wears out" the more he used it.    How long can you sit comfortably?  Unable (2/10 pain seated)    How long can you stand comfortably?  Unable (3/10 pain standing)    How long can you walk comfortably?  Able to walk up to 100 feet in clinic but with pain    Diagnostic tests  MRI    Patient Stated Goals  Get my back fixed    Currently in Pain?  Yes    Pain Score  2     Pain Location  Leg    Pain Orientation  Left    Pain Descriptors / Indicators  Sharp;Shooting    Pain Onset  Other (comment)   2006                       [facility] Adult PT Treatment/Exercise - 12/11/18 0001      Ambulation/Gait   Gait Comments  clinic ambulation 450 feet      Lumbar Exercises: Stretches   Lower Trunk Rotation  5 reps    5 second holds   Lower Trunk Rotation Limitations  --   5 seconds holds   Pelvic Tilt  10 reps;5 seconds      Lumbar Exercises: Standing   Other Standing Lumbar Exercises  standing hip abduction bilat x15 each       Lumbar Exercises: Seated   Other Seated Lumbar Exercises  Slump slider nerve glides LLE   x10     Lumbar Exercises: Supine   Other Supine Lumbar Exercises  Isometric hip abd with belt; 5 x 5"    Other Supine Lumbar Exercises  isometric hip adduction with pillow    5 x 5"     Manual Therapy   Manual Therapy  Soft tissue mobilization    Manual therapy comments  All manual perfromed separate  from other exercise    Soft tissue mobilization  STM to LT lumbar paraspinals, gluteals in sidelying               PT Short Term Goals - 12/04/18 1544      PT SHORT TERM GOAL #1   Title  Patient will be able to transfer from chair (sit to stand) without use of hands or increased pain, to improve functional mobility    Baseline  sit to stand Mod I, use of both hands    Time  2    Period  Weeks    Status  On-going    Target Date  12/16/18      PT SHORT TERM GOAL #2   Title  Patient will be able to perform bed mobility (supine to sidelying, supine to sit) with no increased pain, to return to sleeping in bed    Baseline  Current pain with bed mobility    Time  2    Period  Weeks    Status  On-going    Target Date  12/16/18      PT SHORT TERM GOAL #3   Title  Patient will be able to ambulate community distance (>150 feet) with even weight bearing between RT and LT LE.    Baseline  Antlagic gait, decreased WB on LLE    Time  2    Period  Weeks    Status  On-going    Target Date  12/16/18        PT Long Term Goals - 12/04/18 1544      PT LONG  TERM GOAL #1   Title  Will improve BLE MMT by 1/2 grade to improve functional mobility and transfers    Time  4    Period  Weeks    Status  On-going      PT LONG TERM GOAL #2   Title  Patient will be able to stand >20 min with no increased pain to improve ability to perform ADLs such as cooking    Baseline  unable to stand without pain    Time  4    Period  Weeks    Status  On-going            Plan - 12/11/18 1650    Clinical Impression Statement  Patient showing poor tolerance to treatment. Patient was able to tolerate manual STM somewhat better today, but is significantly limited in ability to participate in ther ex due to pain and apprehension. Educated patient on importance of slow steady progress, and compliance with HEP at a tolerable level, but patient seems quite focused on "getting this fixed" via surgery. Will continue 1-2 sessions to see if patient response improves, otherwise will DC to return of referring provider.    Personal Factors and Comorbidities  Time since onset of injury/illness/exacerbation;Comorbidity 1    Comorbidities  Diabetes    Examination-Activity Limitations  Bathing;Lift;Stand;Bed Mobility;Bend;Locomotion Level;Dressing;Squat;Sleep;Stairs;Transfers    Examination-Participation Restrictions  Community Activity;Yard Work;Other    Stability/Clinical Decision Making  Evolving/Moderate complexity    Rehab Potential  Fair    PT Frequency  2x / week    PT Duration  4 weeks    PT Treatment/Interventions  ADLs/Self Care Home Management;Biofeedback;Electrical Stimulation;Cryotherapy;Traction;Moist Heat;Functional mobility training;Therapeutic activities;Therapeutic exercise;Manual techniques;Passive range of motion;Spinal Manipulations;Joint Manipulations;Neuromuscular re-education;Gait training;DME Instruction;Stair training;Balance training;Patient/family education;Dry needling;Energy conservation;Taping    PT Next Visit Plan  Continue to progress as tolerated.  If no imporvement seen, DC in 1-2  visit.    PT Home Exercise Plan  12/04/18- supine pelvic tilts, isometric hip abd with belt    Consulted and Agree with Plan of Care  Patient       Patient will benefit from skilled therapeutic intervention in order to improve the following deficits and impairments:  Abnormal gait, Impaired sensation, Improper body mechanics, Pain, Difficulty walking, Decreased range of motion, Decreased strength, Decreased activity tolerance, Decreased mobility  Visit Diagnosis: Radiculopathy, lumbar region  Muscle weakness (generalized)  Other abnormalities of gait and mobility     Problem List Patient Active Problem List   Diagnosis Date Noted  . Calculus of gallbladder without cholecystitis without obstruction   . Sludge in gallbladder 02/12/2018    Ronita Hippsameron A Julicia Krieger PT DPT  12/11/2018, 5:07 PM   [facility] [address] [place], [place], [address] Phone: [phone]   Fax:  [phone]  Name: Christopher Hampton MRN: [id number] Date of Birth: 1970-03-29

## 2018-12-15 ENCOUNTER — Other Ambulatory Visit: Payer: Self-pay

## 2018-12-15 ENCOUNTER — Encounter (HOSPITAL_COMMUNITY): Payer: Self-pay

## 2018-12-15 ENCOUNTER — Ambulatory Visit (HOSPITAL_COMMUNITY): Payer: PRIVATE HEALTH INSURANCE

## 2018-12-15 DIAGNOSIS — M5416 Radiculopathy, lumbar region: Secondary | ICD-10-CM | POA: Diagnosis not present

## 2018-12-15 DIAGNOSIS — R2689 Other abnormalities of gait and mobility: Secondary | ICD-10-CM

## 2018-12-15 DIAGNOSIS — M6281 Muscle weakness (generalized): Secondary | ICD-10-CM

## 2018-12-15 NOTE — Therapy (Addendum)
[place], [place], [address] Phone: [phone]   Fax:  [phone]  Physical Therapy Treatment/ Discharge Summary  Patient Details  Name: Christopher Hampton MRN: [id number] Date of Birth: 1970/05/04 Referring Provider (PT): Ophelia Charter   Encounter Date: 12/15/2018   PHYSICAL THERAPY DISCHARGE SUMMARY  Visits from Start of Care: 4  Current functional level related to goals / functional outcomes: See Below   Remaining deficits: See below   Education / Equipment: Patient educated on reassessment findings, DC status and following up with referring provider  Plan: Patient agrees to discharge.  Patient goals were not met. Patient is being discharged due to lack of progress.  ?????   As a licensed physical therapist, I have read and agree with the following note.  Josue Hector PT DPT    PT End of Session - 12/15/18 1637    Visit Number  4    Number of Visits  8    Date for PT Re-Evaluation  12/30/18    Authorization Time Period  12/02/18 to 12/30/18    Authorization - Visit Number  4    Authorization - Number of Visits  8    PT Start Time  1630    PT Stop Time  1700    PT Time Calculation (min)  30 min    Activity Tolerance  Patient limited by pain    Behavior During Therapy  [facility] for tasks assessed/performed       Past Medical History:  Diagnosis Date  . Diabetes mellitus without complication ([place])   . GERD (gastroesophageal reflux disease)     Past Surgical History:  Procedure Laterality Date  . BACK SURGERY    . CHOLECYSTECTOMY N/A 02/23/2018   Procedure: LAPAROSCOPIC CHOLECYSTECTOMY;  Surgeon: Virl Cagey, MD;  Location: [facility];  Service: General;  Laterality: N/A;    There were no vitals filed for this visit.  Subjective Assessment - 12/15/18 1630    Subjective  Pt reports he continues to have no improvements with back pain and radicular symptoms down Lt LE ending at toes.   Reports he has been walking some in driveway.  Blood sugar today at 291.  Stated walking does help some.    How long can you sit comfortably?  Unable (2/10 pain seated)    How long can you stand comfortably?  Unable (3/10 pain standing)    How long can you walk comfortably?  Able to walk up to 100 feet in clinic but with pain    Currently in Pain?  Yes    Pain Score  5     Pain Location  Back    Pain Orientation  Lower;Left    Pain Descriptors / Indicators  Sharp;Stabbing    Pain Type  Chronic pain    Pain Radiating Towards  Lt LE down to heel and toes    Pain Onset  More than a month ago   2006   Pain Frequency  Constant    Aggravating Factors   Bending forward, twisting, lifting    Pain Relieving Factors  unsure, initially extension then increased following    Effect of Pain on Daily Activities  limited         [facility] PT Assessment - 12/15/18 0001      Assessment   Medical Diagnosis  Lumbar radiculopathy (Left)    Referring Provider (PT)  Ophelia Charter    Onset Date/Surgical Date  --  chronic   Next MD Visit  not scheduled, following PT    Prior Therapy  yes      Observation/Other Assessments   Focus on Therapeutic Outcomes (FOTO)   74% limited   was 70% limited     AROM   Lumbar Flexion  Mod restriction    Increased lumbar pain   Lumbar Extension  Max restriction    Increased lumbar pain   Lumbar - Right Side Bend  Mod restriction    Increased lumbar pain   Lumbar - Left Side Bend  Mod restrict   Increased lumbar pain   Lumbar - Right Rotation  Min restrtict   Increased lumbar pain   Lumbar - Left Rotation  Min restrict   Increased lumbar pain     Strength   Right Hip Flexion  4-/5   was 3+/5; limited by pain in back (completed in seated)   Right Hip Extension  --   complete seated (limited by pain Lt side LBP   Right Hip ABduction  3+/5   was 3+/5; limited by pain (completed in seated)   Left Hip Flexion  3+/5   was 3+/5; limited by pain (completed in  seated)   Left Hip Extension  3-/5    Left Hip ABduction  3/5   was 3/5; limited by pain (completed in seated)   Right Knee Flexion  --   limited by pain with testing, did not try with MMT   Right Knee Extension  --   limited by pain with testing, did not try with MMT   Left Knee Flexion  --   limited by pain with testing, did not try with MMT   Left Knee Extension  --   limited by pain with testing, did not try with MMT   Right Ankle Dorsiflexion  4+/5   was 4+   Left Ankle Dorsiflexion  4/5   was 4/5                  [facility] Adult PT Treatment/Exercise - 12/15/18 0001      Bed Mobility   Bed Mobility  --   Educated log rolling to reduce strain on LBP, mod cueing     Ambulation/Gait   Ambulation/Gait  Yes    Ambulation/Gait Assistance  7: Independent    Ambulation Distance (Feet)  150 Feet   increased pain with gait   Assistive device  None    Gait Pattern  Antalgic;Poor foot clearance - left;Decreased weight shift to left;Decreased stance time - left    Ambulation Surface  Level;Indoor    Gait Comments  clinic ambulation 450 feet      Lumbar Exercises: Seated   Sit to Stand Limitations  unable to complete without HHA, due to pain             PT Education - 12/15/18 1720    Education Details  Reviewed log rolling for bed mobility to reduce strain on LBP, gait training to improve equal weight bearing and knee extension with gait    Person(s) Educated  Patient    Methods  Explanation;Demonstration;Tactile cues;Verbal cues    Comprehension  Verbalized understanding;Returned demonstration;Need further instruction       PT Short Term Goals - 12/15/18 1644      PT SHORT TERM GOAL #1   Title  Patient will be able to transfer from chair (sit to stand) without use of hands or increased pain, to improve functional mobility  Baseline  sit to stand Mod I, use of both hands; 10/20 no change    Status  Not Met      PT SHORT TERM GOAL #2   Title  Patient  will be able to perform bed mobility (supine to sidelying, supine to sit) with no increased pain, to return to sleeping in bed    Baseline  Current pain with bed mobility; 10/20:  Reviewed log rolling mechanics, limited pain    Status  Not Met      PT SHORT TERM GOAL #3   Title  Patient will be able to ambulate community distance (>150 feet) with even weight bearing between RT and LT LE.    Baseline  Antlagic gait, decreased WB on LLE; 10/20: antlagic gait, decreased WB on LLE; limited knee extension with Lt LE    Status  On-going        PT Long Term Goals - 12/15/18 1705      PT LONG TERM GOAL #1   Title  Will improve BLE MMT by 1/2 grade to improve functional mobility and transfers    Baseline  10/20: see MMT    Status  Not Met      PT LONG TERM GOAL #2   Title  Patient will be able to stand >20 min with no increased pain to improve ability to perform ADLs such as cooking    Baseline  10/20: unable to stand without pain            Plan - 12/15/18 1707    Clinical Impression Statement  Pt arrived with reports of no improvements with therapy, increased pain in LBP and increased frequency with urination, no change in bowel movements.  Shared this with evaluation PT and reviewed goals with subjective (FOTO) and objective testings.  Pt continues to be limited by pain in LBP and radicular symptoms down LLE to toes.  Pt does report improve tolerance with walking though does increase LBP.  No changes with bed mobility, STS or strength.  FOTO survey indicates increased limitations from 70% to 74% limitations. At this time, patient shows little to no progress toward LTGs, despite compliance with therapy POC. Patient educated on DC status and instructed to follow up with referring provider for further consult.    Personal Factors and Comorbidities  Time since onset of injury/illness/exacerbation;Comorbidity 1    Comorbidities  Diabetes    Examination-Activity Limitations   Bathing;Lift;Stand;Bed Mobility;Bend;Locomotion Level;Dressing;Squat;Sleep;Stairs;Transfers    Examination-Participation Restrictions  Community Activity;Yard Work;Other    Stability/Clinical Decision Making  Evolving/Moderate complexity    Clinical Decision Making  Moderate    Rehab Potential  Fair    PT Frequency  2x / week    PT Duration  4 weeks    PT Treatment/Interventions  ADLs/Self Care Home Management;Biofeedback;Electrical Stimulation;Cryotherapy;Traction;Moist Heat;Functional mobility training;Therapeutic activities;Therapeutic exercise;Manual techniques;Passive range of motion;Spinal Manipulations;Joint Manipulations;Neuromuscular re-education;Gait training;DME Instruction;Stair training;Balance training;Patient/family education;Dry needling;Energy conservation;Taping    PT Next Visit Plan  DC per increased symptoms.  Return to MD following discussion with PT.    PT Home Exercise Plan  12/04/18- supine pelvic tilts, isometric hip abd with belt       Patient will benefit from skilled therapeutic intervention in order to improve the following deficits and impairments:  Abnormal gait, Impaired sensation, Improper body mechanics, Pain, Difficulty walking, Decreased range of motion, Decreased strength, Decreased activity tolerance, Decreased mobility  Visit Diagnosis: Radiculopathy, lumbar region  Muscle weakness (generalized)  Other abnormalities of gait and mobility  Problem List Patient Active Problem List   Diagnosis Date Noted  . Calculus of gallbladder without cholecystitis without obstruction   . Sludge in gallbladder 02/12/2018   Ihor Austin, [place]; [place]  Aldona Lento 12/15/2018, 5:26 PM  [place], [place], [address] Phone: [phone]   Fax:  [phone]  Name: Christopher Hampton MRN: [id number] Date of Birth: 08-Dec-1970

## 2018-12-17 ENCOUNTER — Encounter (HOSPITAL_COMMUNITY): Payer: PRIVATE HEALTH INSURANCE

## 2018-12-22 ENCOUNTER — Encounter (HOSPITAL_COMMUNITY): Payer: PRIVATE HEALTH INSURANCE

## 2018-12-24 ENCOUNTER — Encounter (HOSPITAL_COMMUNITY): Payer: PRIVATE HEALTH INSURANCE | Admitting: Physical Therapy

## 2018-12-29 ENCOUNTER — Encounter (HOSPITAL_COMMUNITY): Payer: PRIVATE HEALTH INSURANCE | Admitting: Physical Therapy

## 2018-12-31 ENCOUNTER — Encounter (HOSPITAL_COMMUNITY): Payer: PRIVATE HEALTH INSURANCE | Admitting: Physical Therapy

## 2019-07-10 ENCOUNTER — Emergency Department (HOSPITAL_COMMUNITY)
Admission: EM | Admit: 2019-07-10 | Discharge: 2019-07-10 | Disposition: A | Discharge status: Home / Self Care | Payer: PRIVATE HEALTH INSURANCE | Attending: Emergency Medicine | Admitting: Emergency Medicine

## 2019-07-10 ENCOUNTER — Encounter (HOSPITAL_COMMUNITY): Payer: Self-pay

## 2019-07-10 ENCOUNTER — Other Ambulatory Visit: Payer: Self-pay

## 2019-07-10 ENCOUNTER — Emergency Department (HOSPITAL_COMMUNITY): Payer: PRIVATE HEALTH INSURANCE

## 2019-07-10 DIAGNOSIS — E119 Type 2 diabetes mellitus without complications: Secondary | ICD-10-CM | POA: Diagnosis not present

## 2019-07-10 DIAGNOSIS — Z7982 Long term (current) use of aspirin: Secondary | ICD-10-CM | POA: Insufficient documentation

## 2019-07-10 DIAGNOSIS — L03115 Cellulitis of right lower limb: Secondary | ICD-10-CM | POA: Diagnosis not present

## 2019-07-10 DIAGNOSIS — T148XXA Other injury of unspecified body region, initial encounter: Secondary | ICD-10-CM

## 2019-07-10 DIAGNOSIS — M79604 Pain in right leg: Secondary | ICD-10-CM | POA: Diagnosis present

## 2019-07-10 DIAGNOSIS — R52 Pain, unspecified: Secondary | ICD-10-CM

## 2019-07-10 DIAGNOSIS — Z7984 Long term (current) use of oral hypoglycemic drugs: Secondary | ICD-10-CM | POA: Insufficient documentation

## 2019-07-10 DIAGNOSIS — Z79899 Other long term (current) drug therapy: Secondary | ICD-10-CM | POA: Diagnosis not present

## 2019-07-10 DIAGNOSIS — Z87891 Personal history of nicotine dependence: Secondary | ICD-10-CM | POA: Diagnosis not present

## 2019-07-10 LAB — CBC WITH DIFFERENTIAL/PLATELET
Abs Immature Granulocytes: 0.02 10*3/uL (ref 0.00–0.07)
Basophils Absolute: 0 10*3/uL (ref 0.0–0.1)
Basophils Relative: 1 %
Eosinophils Absolute: 0.2 10*3/uL (ref 0.0–0.5)
Eosinophils Relative: 4 %
HCT: 39.1 % (ref 39.0–52.0)
Hemoglobin: 14 g/dL (ref 13.0–17.0)
Immature Granulocytes: 0 %
Lymphocytes Relative: 25 %
Lymphs Abs: 1.5 10*3/uL (ref 0.7–4.0)
MCH: 32.6 pg (ref 26.0–34.0)
MCHC: 35.8 g/dL (ref 30.0–36.0)
MCV: 90.9 fL (ref 80.0–100.0)
Monocytes Absolute: 0.5 10*3/uL (ref 0.1–1.0)
Monocytes Relative: 9 %
Neutro Abs: 3.7 10*3/uL (ref 1.7–7.7)
Neutrophils Relative %: 61 %
Platelets: 161 10*3/uL (ref 150–400)
RBC: 4.3 MIL/uL (ref 4.22–5.81)
RDW: 12.1 % (ref 11.5–15.5)
WBC: 6.1 10*3/uL (ref 4.0–10.5)
nRBC: 0 % (ref 0.0–0.2)

## 2019-07-10 LAB — BASIC METABOLIC PANEL
Anion gap: 10 (ref 5–15)
BUN: 14 mg/dL (ref 6–20)
CO2: 23 mmol/L (ref 22–32)
Calcium: 9.2 mg/dL (ref 8.9–10.3)
Chloride: 100 mmol/L (ref 98–111)
Creatinine, Ser: 0.83 mg/dL (ref 0.61–1.24)
GFR calc Af Amer: >60 mL/min (ref >60)
GFR calc non Af Amer: >60 mL/min (ref >60)
Glucose, Bld: 239 mg/dL — ABNORMAL HIGH (ref 70–99)
Potassium: 3.8 mmol/L (ref 3.5–5.1)
Sodium: 133 mmol/L — ABNORMAL LOW (ref 135–145)

## 2019-07-10 LAB — CK: Total CK: 58 U/L (ref 49–397)

## 2019-07-10 MED ORDER — CEPHALEXIN 500 MG PO CAPS: 500.0000 mg | ORAL_CAPSULE | Freq: Two times a day (BID) | ORAL | 0 refills | Status: AC

## 2019-07-10 MED ORDER — HYDROCODONE-ACETAMINOPHEN 5-325 MG PO TABS: 2.0000 | ORAL_TABLET | ORAL | 0 refills | Status: DC | PRN

## 2019-07-10 MED ORDER — CELECOXIB 200 MG PO CAPS: 200.0000 mg | ORAL_CAPSULE | Freq: Two times a day (BID) | ORAL | 0 refills | Status: DC

## 2019-07-10 NOTE — ED Notes (Signed)
Have contacted radiology to notify that there is a patient that needs an ultrasound

## 2019-07-10 NOTE — ED Provider Notes (Signed)
Medical screening examination/treatment/procedure(s) were conducted as a shared visit with non-physician practitioner(s) and myself.  I personally evaluated the patient during the encounter.      Patient seen by me along with physician assistant.  Patient's had left leg posterior calf swelling with some swelling in the proximal calf as well for about a week.  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  Patient obtain gabapentin for pain.  Patient has known back problems.  And does have some persistent sciatica in the left leg.  But no change in that.  No swelling to the foot or ankle.  There is a area of erythema at the distal posterior aspect of the thigh but there were some home remedies that were ongoing with massaging and things that may have created some of that.  Reassuring that there is no ankle swelling.  Also reassuring that is been ongoing for a week.  So I think a serious deep space of muscle infection is unlikely.  Ultrasounds available so we will get ultrasound to rule out DVT but without the ankle swelling I think it is less likely.  We will get basic labs CBC white count is normal and platelets are normal as well.  Patient also has CK and basic metabolic panel.  May proceed with a CT scan of the thigh to evaluate the muscle in that area after with the ultrasound results are back.   Vanetta Mulders, MD 07/10/19 [phone]

## 2019-07-10 NOTE — ED Provider Notes (Signed)
[facility] EMERGENCY DEPARTMENT Provider Note   CSN: [id number] Arrival date & time: 07/10/19  0759     History Chief Complaint  Patient presents with  . Leg Pain    Christopher Hampton is a 49 y.o. male with a pmh of previous lumbar surgeries. He presents with c/o R posterior leg pain. 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 He noticed swelling to his R posterior leg one week ago which has progressively worsened. He has difficulty straightening the leg. He has no pain when going up stairs or climbing a ladder. 2 nights ago his wife massaged the are with biofreeze and applied a tens unit. Since that time he noticed a large area of pain and redness develop. He denies other injury. He denies any known insect bites, fever, chills, malaise, fatigue. The pain worsened after sitting for long periods of time, straightening the leg or applying direct pressure.  HPI     Past Medical History:  Diagnosis Date  . Diabetes mellitus without complication (HCC)   . GERD (gastroesophageal reflux disease)     Patient Active Problem List   Diagnosis Date Noted  . Calculus of gallbladder without cholecystitis without obstruction   . Sludge in gallbladder 02/12/2018    Past Surgical History:  Procedure Laterality Date  . BACK SURGERY    . CHOLECYSTECTOMY N/A 02/23/2018   Procedure: LAPAROSCOPIC CHOLECYSTECTOMY;  Surgeon: Lucretia Roers, MD;  Location: [facility];  Service: General;  Laterality: N/A;       Family History  Problem Relation Age of Onset  . Epilepsy Father     Social History   Tobacco Use  . Smoking status: Former Smoker    Packs/day: 1.50    Years: 30.00    Pack years: 45.00    Types: Cigarettes    Quit date: 01/14/2018    Years since quitting: 1.4  . Smokeless tobacco: Current User    Types: Chew  Substance Use Topics  . Alcohol use: Yes    Alcohol/week:  14.0 standard drinks    Types: 14 Cans of beer per week    Comment: daily  . Drug use: No    Home Medications Prior to Admission medications   Medication Sig Start Date End Date Taking? Authorizing Provider  aspirin EC 81 MG tablet Take 81 mg by mouth daily.    [provider]  calcium carbonate (TUMS - DOSED IN MG ELEMENTAL CALCIUM) 500 MG chewable tablet Chew 2-3 tablets by mouth 3 (three) times daily as needed for indigestion or heartburn.     [provider]  gabapentin (NEURONTIN) 300 MG capsule Take 300 mg by mouth 3 (three) times daily. 10/30/18   [provider]  lisinopril-hydrochlorothiazide (PRINZIDE,ZESTORETIC) 10-12.5 MG tablet Take 1 tablet by mouth daily. 01/17/18   [provider]  metFORMIN (GLUCOPHAGE) 500 MG tablet Take 500 mg by mouth daily. 01/17/18   [provider]  omeprazole (PRILOSEC) 20 MG capsule Take 20 mg by mouth daily. 01/17/18   [provider]    Allergies    Patient has no known allergies.  Review of Systems   Review of Systems Ten systems reviewed and are negative for acute change, except as noted in the HPI.   Physical Exam Updated Vital Signs BP (!) 146/79 (BP Location: Right Arm)   Pulse 84  Temp 98 F (36.7 C) (Oral)   Ht 5\' 10"  (1.778 m)   Wt 92.5 kg   SpO2 99%   BMI 29.27 kg/m   Physical Exam Vitals and nursing note reviewed.  Constitutional:      General: He is not in acute distress.    Appearance: He is well-developed. He is not diaphoretic.  HENT:     Head: Normocephalic and atraumatic.  Eyes:     General: No scleral icterus.    Conjunctiva/sclera: Conjunctivae normal.  Cardiovascular:     Rate and Rhythm: Normal rate and regular rhythm.     Heart sounds: Normal heart sounds.  Pulmonary:     Effort: Pulmonary effort is normal. No respiratory distress.     Breath sounds: Normal breath sounds.  Abdominal:     Palpations: Abdomen is soft.     Tenderness: There is no  abdominal tenderness.  Musculoskeletal:        General: Swelling and tenderness present.     Cervical back: Normal range of motion and neck supple.     Comments: Swelling to the posterior right thigh.  Swelling extends to the popliteal fossa.  Calf compartments are soft.  There is an area of erythema medially with some petechiae centrally.  It is tender to the touch, warm and mildly erythematous.  No streaking.  Pain with passive extension of the knee.  Skin:    General: Skin is warm and dry.  Neurological:     Mental Status: He is alert.  Psychiatric:        Behavior: Behavior normal.         ED Results / Procedures / Treatments   Labs (all labs ordered are listed, but only abnormal results are displayed) Labs Reviewed  BASIC METABOLIC PANEL  CBC WITH DIFFERENTIAL/PLATELET    EKG None  Radiology No results found.  Procedures Procedures (including critical care time)  Medications Ordered in ED Medications - No data to display  ED Course  I have reviewed the triage vital signs and the nursing notes.  Pertinent labs & imaging results that were available during my care of the patient were reviewed by me and considered in my medical decision making (see chart for details).    MDM Rules/Calculators/A&P                      49 year old male here with right posterior leg pain and swelling.  Differential includes deep space infection however I doubt that given the fact that has been present for a week.  I also doubt necrotizing fasciitis although patient is diabetic.  Is afebrile and well-appearing.  He has no systemic symptoms.  Differential also includes DVT however ultrasound is negative for such.  I ordered and reviewed these images and agree with interpretation. Patient has erythema and swelling to the posterior aspect of the leg and although I think this is most likely just hematoma he does state that the swelling began prior to massage of the leg.  As such I will start  the patient on antibiotics with doxycycline.  Patient given pain medications, crutches so he can toe-touch ambulate which is more comfortable for him at this time.  There is not appear to be any emergent cause of the patient's symptoms today.  He is advised to follow closely with his primary care physician, Ortho or return for worsening symptoms.  Discussed return precautions Final Clinical Impression(s) / ED Diagnoses Final diagnoses:  None  Rx / DC Orders ED Discharge Orders    None       Arthor Captain, PA-C 07/10/19 1132    Vanetta Mulders, MD 07/11/19 [phone]

## 2019-07-10 NOTE — Discharge Instructions (Signed)

## 2019-07-10 NOTE — ED Triage Notes (Signed)
Pt is having left leg pain and swelling that started 1 week ago. He states he noticed swelling and has a pain that is constant. He has taken Gabapentin for pain with no relief. Pt took Aleve this morning.

## 2021-07-13 ENCOUNTER — Other Ambulatory Visit (HOSPITAL_COMMUNITY): Payer: Self-pay | Admitting: Adult Health Nurse Practitioner

## 2021-07-13 ENCOUNTER — Other Ambulatory Visit: Payer: Self-pay | Admitting: Adult Health Nurse Practitioner

## 2021-07-13 DIAGNOSIS — R0989 Other specified symptoms and signs involving the circulatory and respiratory systems: Secondary | ICD-10-CM

## 2021-08-28 LAB — COLOGUARD: COLOGUARD: NEGATIVE

## 2022-04-08 IMAGING — US US EXTREM LOW VENOUS*L*
1 series · 13 of 24 positions shown · non-contrast
Comparison: None.

CLINICAL DATA: Left lower extremity pain for the past week.
Evaluate for DVT.



[Series 1: us venous img lower uni left (dvt) · portal-venous · 13 of 35 slices shown]
[im 1/35]
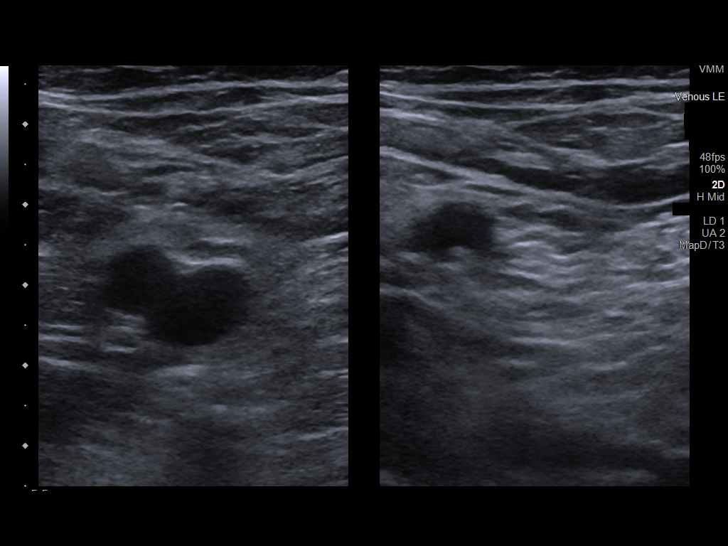
[im 3/35]
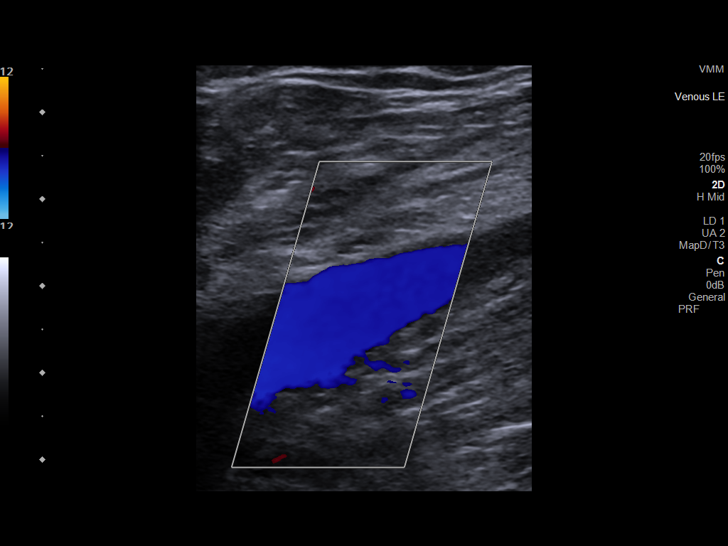
[im 6/35]
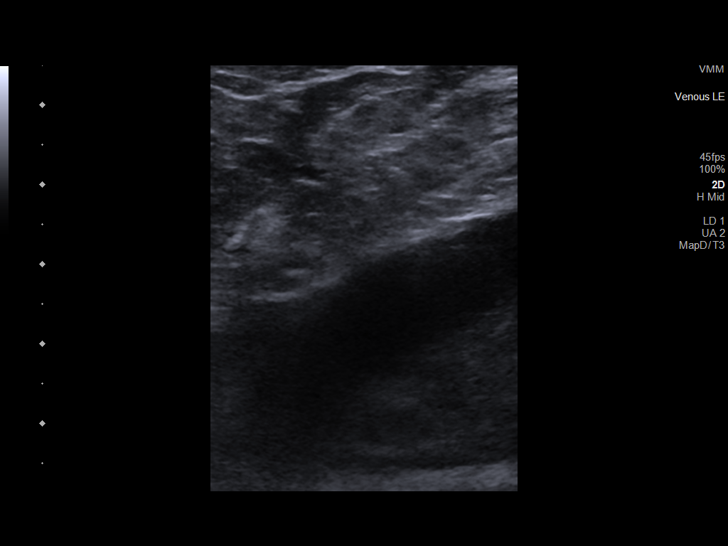
[im 9/35]
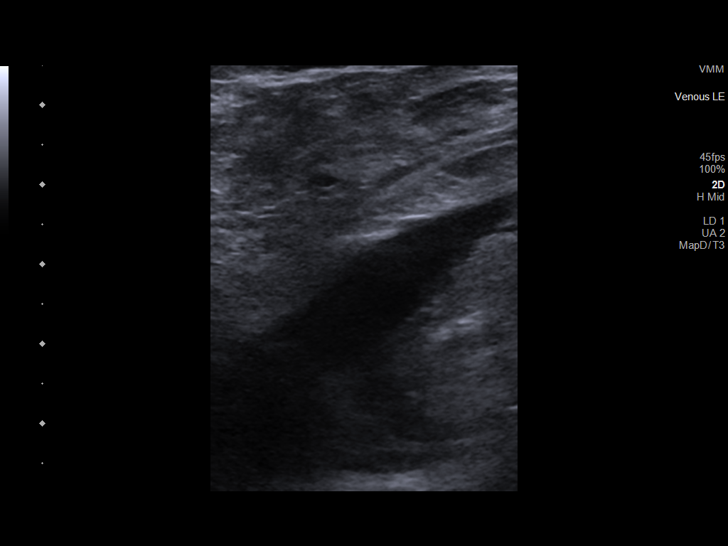
[im 12/35]
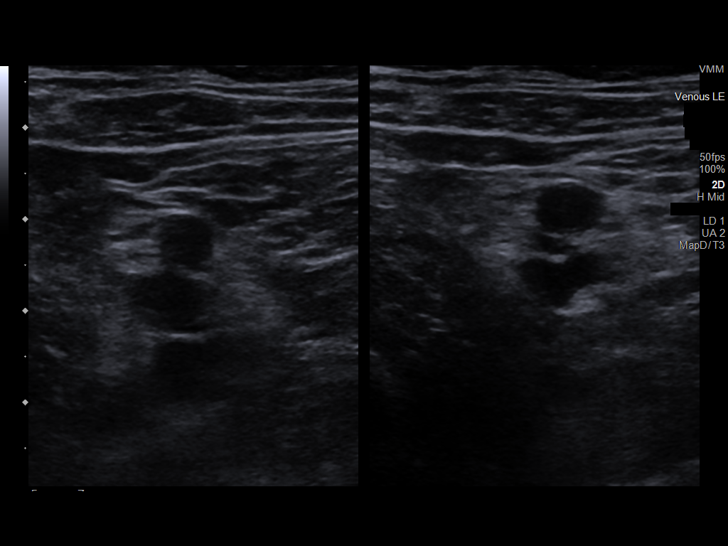
[im 15/35]
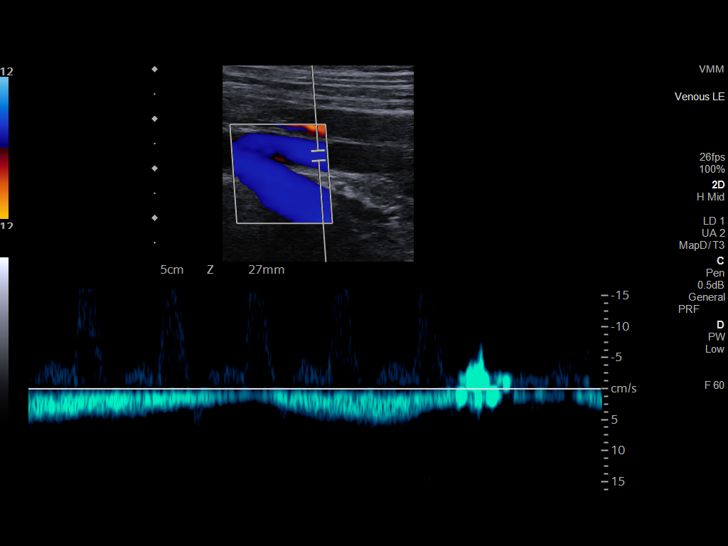
[im 18/35]
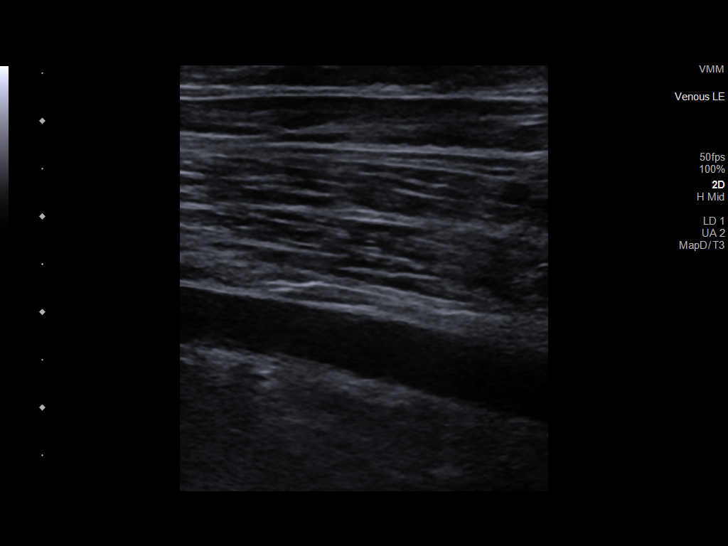
[im 20/35]
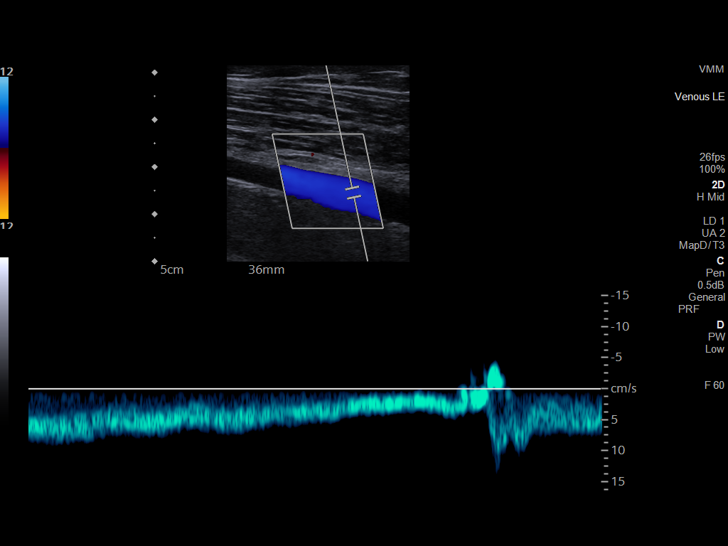
[im 23/35]
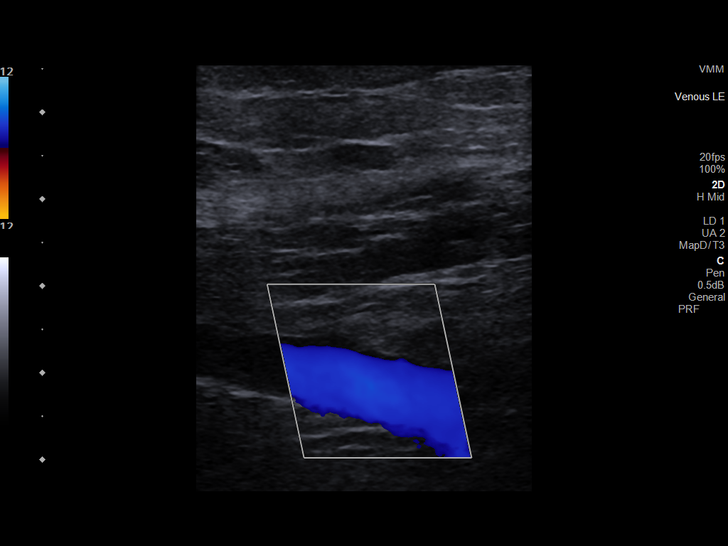
[im 26/35]
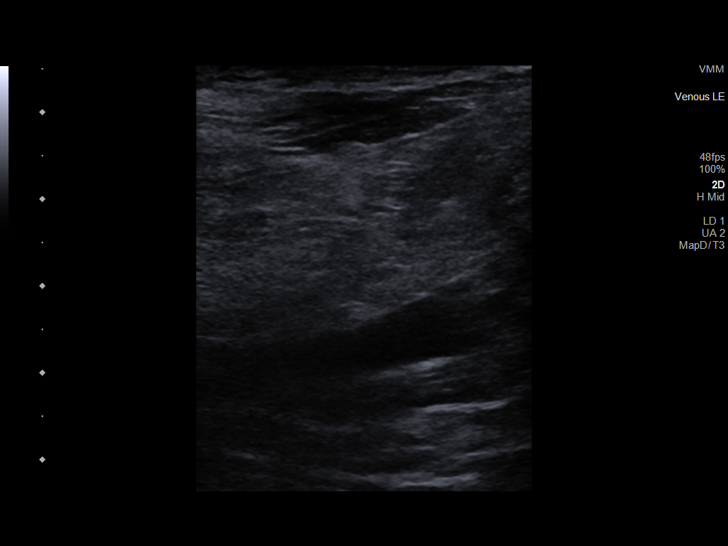
[im 29/35]
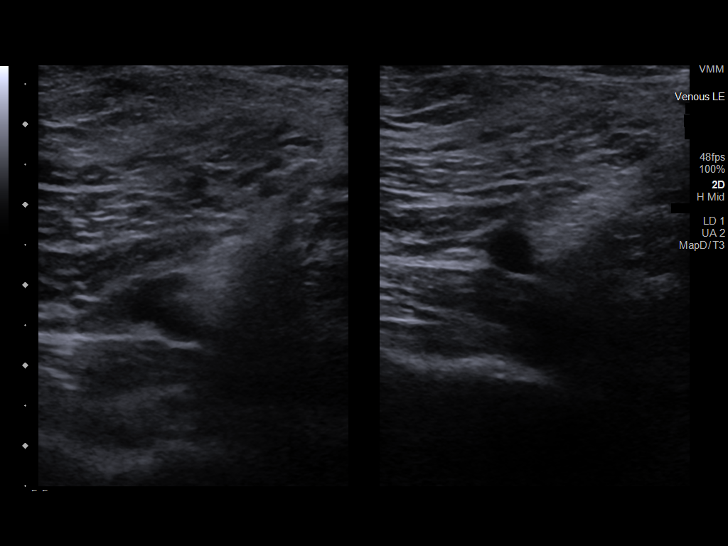
[im 32/35]
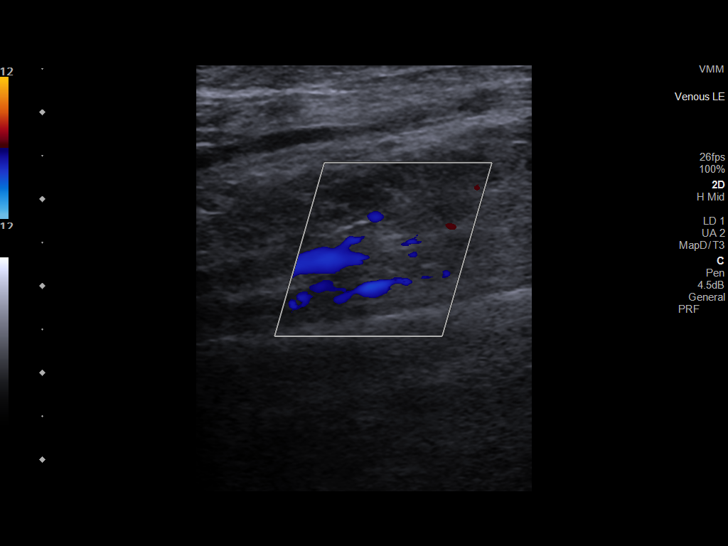
[im 35/35]
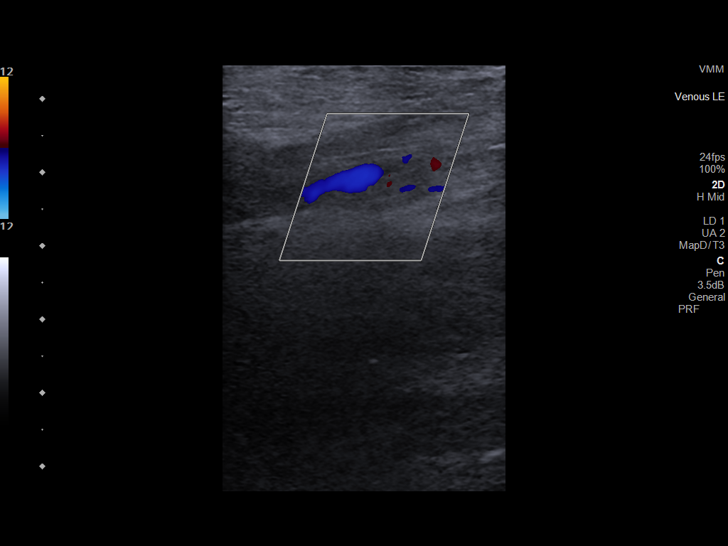

[13 of 24 positions shown; findings below may reference images not displayed]

FINDINGS: Contralateral Common Femoral Vein: Respiratory phasicity is normal
and symmetric with the symptomatic side. No evidence of thrombus.
Normal compressibility.

Common Femoral Vein: No evidence of thrombus. Normal
compressibility, respiratory phasicity and response to augmentation.

Saphenofemoral Junction: No evidence of thrombus. Normal
compressibility and flow on color Doppler imaging.

Profunda Femoral Vein: No evidence of thrombus. Normal
compressibility and flow on color Doppler imaging.

Femoral Vein: No evidence of thrombus. Normal compressibility,
respiratory phasicity and response to augmentation.

Popliteal Vein: No evidence of thrombus. Normal compressibility,
respiratory phasicity and response to augmentation.

Calf Veins: No evidence of thrombus. Normal compressibility and flow
on color Doppler imaging.

Superficial Great Saphenous Vein: No evidence of thrombus. Normal
compressibility.

Venous Reflux:  None.

Other Findings:  None.
IMPRESSION: No evidence of DVT within the left lower extremity.

## 2022-07-15 ENCOUNTER — Other Ambulatory Visit: Payer: Self-pay

## 2022-07-15 ENCOUNTER — Emergency Department (HOSPITAL_COMMUNITY)
Admission: EM | Admit: 2022-07-15 | Discharge: 2022-07-15 | Disposition: A | Discharge status: Home / Self Care | Payer: PRIVATE HEALTH INSURANCE | Attending: Emergency Medicine | Admitting: Emergency Medicine

## 2022-07-15 ENCOUNTER — Encounter (HOSPITAL_COMMUNITY): Payer: Self-pay | Admitting: *Deleted

## 2022-07-15 DIAGNOSIS — Z7982 Long term (current) use of aspirin: Secondary | ICD-10-CM | POA: Diagnosis not present

## 2022-07-15 DIAGNOSIS — R002 Palpitations: Secondary | ICD-10-CM

## 2022-07-15 DIAGNOSIS — Z7984 Long term (current) use of oral hypoglycemic drugs: Secondary | ICD-10-CM | POA: Diagnosis not present

## 2022-07-15 DIAGNOSIS — E876 Hypokalemia: Secondary | ICD-10-CM | POA: Diagnosis not present

## 2022-07-15 DIAGNOSIS — Z79899 Other long term (current) drug therapy: Secondary | ICD-10-CM | POA: Diagnosis not present

## 2022-07-15 DIAGNOSIS — I10 Essential (primary) hypertension: Secondary | ICD-10-CM | POA: Diagnosis not present

## 2022-07-15 DIAGNOSIS — E119 Type 2 diabetes mellitus without complications: Secondary | ICD-10-CM | POA: Insufficient documentation

## 2022-07-15 DIAGNOSIS — E871 Hypo-osmolality and hyponatremia: Secondary | ICD-10-CM | POA: Insufficient documentation

## 2022-07-15 HISTORY — DX: Anxiety disorder, unspecified: F41.9

## 2022-07-15 LAB — CBC WITH DIFFERENTIAL/PLATELET
Abs Immature Granulocytes: 0.02 10*3/uL (ref 0.00–0.07)
Basophils Absolute: 0.1 10*3/uL (ref 0.0–0.1)
Basophils Relative: 1 %
Eosinophils Absolute: 0.3 10*3/uL (ref 0.0–0.5)
Eosinophils Relative: 4 %
HCT: 39 % (ref 39.0–52.0)
Hemoglobin: 14.1 g/dL (ref 13.0–17.0)
Immature Granulocytes: 0 %
Lymphocytes Relative: 31 %
Lymphs Abs: 2.1 10*3/uL (ref 0.7–4.0)
MCH: 30.7 pg (ref 26.0–34.0)
MCHC: 36.2 g/dL — ABNORMAL HIGH (ref 30.0–36.0)
MCV: 84.8 fL (ref 80.0–100.0)
Monocytes Absolute: 0.5 10*3/uL (ref 0.1–1.0)
Monocytes Relative: 8 %
Neutro Abs: 3.8 10*3/uL (ref 1.7–7.7)
Neutrophils Relative %: 56 %
Platelets: 182 10*3/uL (ref 150–400)
RBC: 4.6 MIL/uL (ref 4.22–5.81)
RDW: 12.1 % (ref 11.5–15.5)
WBC: 6.8 10*3/uL (ref 4.0–10.5)
nRBC: 0 % (ref 0.0–0.2)

## 2022-07-15 LAB — BASIC METABOLIC PANEL
Anion gap: 12 (ref 5–15)
BUN: 17 mg/dL (ref 6–20)
CO2: 22 mmol/L (ref 22–32)
Calcium: 9 mg/dL (ref 8.9–10.3)
Chloride: 96 mmol/L — ABNORMAL LOW (ref 98–111)
Creatinine, Ser: 1.03 mg/dL (ref 0.61–1.24)
GFR, Estimated: >60 mL/min (ref >60)
Glucose, Bld: 154 mg/dL — ABNORMAL HIGH (ref 70–99)
Potassium: 3 mmol/L — ABNORMAL LOW (ref 3.5–5.1)
Sodium: 130 mmol/L — ABNORMAL LOW (ref 135–145)

## 2022-07-15 MED ORDER — POTASSIUM CHLORIDE CRYS ER 20 MEQ PO TBCR: 20.0000 meq | EXTENDED_RELEASE_TABLET | Freq: Every day | ORAL | 0 refills | Status: DC

## 2022-07-15 MED ORDER — POTASSIUM CHLORIDE CRYS ER 20 MEQ PO TBCR
40.0000 meq | EXTENDED_RELEASE_TABLET | Freq: Once | ORAL | Status: AC
  Administered 2022-07-15: 40 meq via ORAL
  Filled 2022-07-15: qty 2

## 2022-07-15 NOTE — ED Triage Notes (Signed)
Pt with palpitations for about an hour today,  pt denies any SOB or CP.

## 2022-07-15 NOTE — Discharge Instructions (Signed)
Take the potassium prescribed, you may need to take this every day since you are on a diuretic which will remove potassium from your body.  It is important that you are drinking plenty of fluids, recommend Gatorade to help replace electrolytes including sodium.  Plan follow-up with your primary doctor as we discussed.

## 2022-07-17 NOTE — ED Provider Notes (Signed)
[place] EMERGENCY DEPARTMENT AT [facility] Provider Note   CSN: [id number] Arrival date & time: 07/15/22  1819     History  Chief Complaint  Patient presents with   Palpitations    Christopher Hampton is a 52 y.o. male with Type II DM, GERD, HTN and history of anxiety presenting for evaluation of palpations which started around 5 pm today while standing in his kitchen.  The episode lasted for about an hour, spontaneously resolving about the time of arrival here.  He denies having any sob or chest pain with this episode, no dizziness, but became very anxious during the course of the episode.  He was not anxious prior to the event.  He denies excessive caffeine intake, no drug use, no cold medicine use (sudafed).  He is currently symptom free.  The history is provided by the patient and the spouse.       Home Medications Prior to Admission medications   Medication Sig Start Date End Date Taking? Authorizing Provider  potassium chloride SA (KLOR-CON M) 20 MEQ tablet Take 1 tablet (20 mEq total) by mouth daily. 07/15/22  Yes IdolRaynelle Fanning, PA-C  aspirin EC 81 MG tablet Take 81 mg by mouth daily.    [provider]  calcium carbonate (TUMS - DOSED IN MG ELEMENTAL CALCIUM) 500 MG chewable tablet Chew 2-3 tablets by mouth 3 (three) times daily as needed for indigestion or heartburn.     [provider]  celecoxib (CELEBREX) 200 MG capsule Take 1 capsule (200 mg total) by mouth 2 (two) times daily. 07/10/19   Arthor Captain, PA-C  HYDROcodone-acetaminophen (NORCO) 5-325 MG tablet Take 2 tablets by mouth every 4 (four) hours as needed. 07/10/19   Harris, Abigail, PA-C  lisinopril-hydrochlorothiazide (PRINZIDE,ZESTORETIC) 10-12.5 MG tablet Take 1 tablet by mouth daily. 01/17/18   [provider]  metFORMIN (GLUCOPHAGE) 500 MG tablet Take 500 mg by mouth daily. 01/17/18   [provider]  naproxen sodium (ALEVE) 220 MG tablet Take 440 mg by mouth daily as  needed (pain).    [provider]  omeprazole (PRILOSEC) 20 MG capsule Take 20 mg by mouth daily. 01/17/18   [provider]      Allergies    Patient has no known allergies.    Review of Systems   Review of Systems  Constitutional:  Negative for fever.  HENT:  Negative for congestion.   Eyes: Negative.   Respiratory:  Negative for chest tightness and shortness of breath.   Cardiovascular:  Positive for palpitations. Negative for chest pain.  Gastrointestinal:  Negative for abdominal pain and nausea.  Genitourinary: Negative.   Musculoskeletal: Negative.   Skin: Negative.   Neurological:  Negative for dizziness, weakness, light-headedness, numbness and headaches.  Psychiatric/Behavioral: Negative.      Physical Exam Updated Vital Signs BP 117/80   Pulse 79   Temp 98.1 F (36.7 C)   Resp 11   Ht 5\' 10"  (1.778 m)   Wt 83.9 kg   SpO2 100%   BMI 26.54 kg/m  Physical Exam Vitals and nursing note reviewed.  Constitutional:      Appearance: He is well-developed.  HENT:     Head: Normocephalic and atraumatic.  Eyes:     Conjunctiva/sclera: Conjunctivae normal.  Cardiovascular:     Rate and Rhythm: Normal rate and regular rhythm.     Heart sounds: Normal heart sounds.  Pulmonary:     Effort: Pulmonary effort is normal. No respiratory distress.  Breath sounds: Normal breath sounds. No wheezing or rhonchi.  Abdominal:     General: Bowel sounds are normal.     Palpations: Abdomen is soft.     Tenderness: There is no abdominal tenderness.  Musculoskeletal:        General: Normal range of motion.     Cervical back: Normal range of motion.     Right lower leg: No edema.     Left lower leg: No edema.  Skin:    General: Skin is warm and dry.  Neurological:     General: No focal deficit present.     Mental Status: He is alert.     ED Results / Procedures / Treatments   Labs (all labs ordered are listed, but only abnormal results are  displayed) Labs Reviewed  CBC WITH DIFFERENTIAL/PLATELET - Abnormal; Notable for the following components:      Result Value   MCHC 36.2 (*)    All other components within normal limits  BASIC METABOLIC PANEL - Abnormal; Notable for the following components:   Sodium 130 (*)    Potassium 3.0 (*)    Chloride 96 (*)    Glucose, Bld 154 (*)    All other components within normal limits    EKG EKG Interpretation  Date/Time:  Monday Jul 15 2022 18:28:53 EDT Ventricular Rate:  92 PR Interval:  154 QRS Duration: 90 QT Interval:  362 QTC Calculation: 447 R Axis:   59 Text Interpretation: Normal sinus rhythm Normal ECG When compared with ECG of 25-Feb-2014 14:57, PREVIOUS ECG IS PRESENT Confirmed by Vonita Moss [phone]) on 07/16/2022 10:38:56 AM  Radiology No results found.  Procedures Procedures    Medications Ordered in ED Medications  potassium chloride SA (KLOR-CON M) CR tablet 40 mEq (40 mEq Oral Given 07/15/22 2106)    ED Course/ Medical Decision Making/ A&P                             Medical Decision Making Pt with a nearly 1 hour episode of palpitations resolved upon arrival here.  No cp, no sob, currently no complaint of sx.  Ddx including svt, paroxysmal afib, pvcs, electrolyte disturbance, anxiety.  Labs indicating hypokalemia and hyponatremia.  He is on hctz which could be reason for the hypokalemia.  He works in [employer], doubts dehydration.  Pt was offered IV fluids NS to help replace his sodium,  he defers IV tx at this time.  He was willing to take oral potassium.     Amount and/or Complexity of Data Reviewed Labs: ordered.    Details: Na 130,  K+ 3.0., all other results normal. ECG/medicine tests: ordered.    Details: Nsr rate 92.  Reviewed monitor - no events during monitored stay. Discussion of management or test interpretation with external provider(s): Pt offered referral to cardiology.  He defers this, will return here or see pcp  if sx return.  Risk Prescription drug management.           Final Clinical Impression(s) / ED Diagnoses Final diagnoses:  Palpitations  Hypokalemia  Hyponatremia    Rx / DC Orders ED Discharge Orders          Ordered    potassium chloride SA (KLOR-CON M) 20 MEQ tablet  Daily        07/15/22 2139              Burgess Amor, Cordelia Poche 07/18/22 2159  Lonell Grandchild, MD 07/22/22 0830

## 2023-02-26 HISTORY — PX: CYST EXCISION: SHX5701

## 2023-09-30 ENCOUNTER — Encounter (INDEPENDENT_AMBULATORY_CARE_PROVIDER_SITE_OTHER): Payer: Self-pay | Admitting: *Deleted

## 2023-10-13 ENCOUNTER — Encounter: Payer: Self-pay | Admitting: Gastroenterology

## 2023-11-25 ENCOUNTER — Encounter: Payer: Self-pay | Admitting: Gastroenterology

## 2023-11-25 ENCOUNTER — Ambulatory Visit: Payer: PRIVATE HEALTH INSURANCE | Admitting: Gastroenterology

## 2023-11-25 ENCOUNTER — Encounter: Payer: Self-pay | Admitting: *Deleted

## 2023-11-25 ENCOUNTER — Other Ambulatory Visit: Payer: Self-pay | Admitting: *Deleted

## 2023-11-25 VITALS — BP 106/69 | HR 87 | Temp 98.4°F | Ht 69.0 in | Wt 188.4 lb

## 2023-11-25 DIAGNOSIS — Z1211 Encounter for screening for malignant neoplasm of colon: Secondary | ICD-10-CM | POA: Insufficient documentation

## 2023-11-25 DIAGNOSIS — K219 Gastro-esophageal reflux disease without esophagitis: Secondary | ICD-10-CM

## 2023-11-25 DIAGNOSIS — K5909 Other constipation: Secondary | ICD-10-CM | POA: Diagnosis not present

## 2023-11-25 DIAGNOSIS — K760 Fatty (change of) liver, not elsewhere classified: Secondary | ICD-10-CM | POA: Diagnosis not present

## 2023-11-25 MED ORDER — PEG 3350-KCL-NA BICARB-NACL 420 G PO SOLR: 4000.0000 mL | Freq: Once | ORAL | 0 refills | Status: AC

## 2023-11-25 NOTE — Progress Notes (Signed)
 GI Office Note    Referring Provider: Suanne Pfeiffer, NP Primary Care Physician:  Suanne Pfeiffer, NP  Primary Gastroenterologist: Ozell Hollingshead, MD   Chief Complaint   Chief Complaint  Patient presents with   New Patient (Initial Visit)    Pt here for ov before colonoscopy. Pt takes laxatives     History of Present Illness   Christopher Hampton is a 53 y.o. male presenting today to schedule first after colonoscopy.    Discussed the use of AI scribe software for clinical note transcription with the patient, who gave verbal consent to proceed.   He experiences constipation and abdominal pain, with difficulty in bowel movements, stating, 'I have a hard time going to the bathroom.' Noticed symptoms really after starting Mounjaro previously. He takes Linzess 290 mcg daily and drinks a 12-ounce cup of prune juice. Without these, he can go two to three days without a bowel movement, whereas he used to have bowel movements three to four times a day. Sharp right sided abdominal pain occurs if he does not have a bowel movement for three to four days.  He used to way 275 pounds and had diabetes. Several years ago he decided to quit smoking and start eating better and was able to drop his weight. He states his A1c consistently below 5.0. He was previously on Mounjaro for diabetes management, which he believes contributed to his constipation. He has been off Mounjaro for over a year and manages his diabetes through diet, with his last A1c recorded at 5.2 in August.   He experiences occasional heartburn and has been on omeprazole for four to five years, which controls his symptoms effectively. He has been intermittent on omeprazole. No swallowing difficulties.  He has a history of a fatty liver noted on a CT scan in 2019, but no recent imaging has been done. He reports minimal alcohol consumption, approximately three beers per month however remotely he would drink more heavily but not  consistently or daily.     Prior Data   09/2023: A1C 5.2, lipase 43, B12 672, cre 0.92, Tbili 0.6, AP 97, AST 15, ALT17 Medications   Current Outpatient Medications  Medication Sig Dispense Refill   aspirin EC 81 MG tablet Take 81 mg by mouth daily.     cholecalciferol (VITAMIN D3) 25 MCG (1000 UNIT) tablet Take 1,000 Units by mouth daily.     hydrochlorothiazide  (HYDRODIURIL ) 12.5 MG tablet Take 12.5 mg by mouth daily.     linaclotide (LINZESS) 290 MCG CAPS capsule Take 290 mcg by mouth daily before breakfast.     lisinopril-hydrochlorothiazide  (PRINZIDE,ZESTORETIC) 10-12.5 MG tablet Take 1 tablet by mouth daily.  0   omeprazole (PRILOSEC) 20 MG capsule Take 20 mg by mouth daily.  3   vitamin B-12 (CYANOCOBALAMIN) 500 MCG tablet Take 500 mcg by mouth daily.     No current facility-administered medications for this visit.    Allergies   Allergies as of 11/25/2023   (No Known Allergies)    Past Medical History   Past Medical History:  Diagnosis Date   Anxiety    Diabetes mellitus without complication (HCC)    GERD (gastroesophageal reflux disease)     Past Surgical History   Past Surgical History:  Procedure Laterality Date   BACK SURGERY     CHOLECYSTECTOMY N/A 02/23/2018   Procedure: LAPAROSCOPIC CHOLECYSTECTOMY;  Surgeon: Kallie Manuelita BROCKS, MD;  Location: [facility];  Service: General;  Laterality: N/A;   HIP SURGERY  Past Family History   Family History  Problem Relation Age of Onset   Epilepsy Father    Colon cancer Neg Hx     Past Social History   Social History   Socioeconomic History   Marital status: Married    Spouse name: Not on file   Number of children: Not on file   Years of education: Not on file   Highest education level: Not on file  Occupational History   Not on file  Tobacco Use   Smoking status: Former    Current packs/day: 0.00    Average packs/day: 1.5 packs/day for 30.0 years (45.0 ttl pk-yrs)    Types: Cigarettes    Start  date: 01/15/1988    Quit date: 01/14/2018    Years since quitting: 5.8   Smokeless tobacco: Current    Types: Chew  Vaping Use   Vaping status: Not on file  Substance and Sexual Activity   Alcohol use: Yes    Alcohol/week: 14.0 standard drinks of alcohol    Types: 14 Cans of beer per week    Comment: daily   Drug use: No   Sexual activity: Yes    Birth control/protection: None  Other Topics Concern   Not on file  Social History Narrative   Not on file   Social Drivers of Health   Financial Resource Strain: Low Risk  (09/23/2023)   Received from [facility]   Overall Financial Resource Strain (CARDIA)    How hard is it for you to pay for the very basics like food, housing, medical care, and heating?: Not hard at all  Food Insecurity: No Food Insecurity (09/23/2023)   Received from [facility]   Hunger Vital Sign    Within the past 12 months, you worried that your food would run out before you got the money to buy more.: Never true    Within the past 12 months, the food you bought just didn't last and you didn't have money to get more.: Never true  Transportation Needs: No Transportation Needs (09/23/2023)   Received from [facility] - Transportation    In the past 12 months, has lack of transportation kept you from medical appointments or from getting medications?: No    In the past 12 months, has lack of transportation kept you from meetings, work, or from getting things needed for daily living?: No  Physical Activity: Sufficiently Active (09/23/2023)   Received from [facility]   Exercise Vital Sign    On average, how many days per week do you engage in moderate to strenuous exercise (like a brisk walk)?: 7 days    On average, how many minutes do you engage in exercise at this level?: 30 min  Stress: Stress Concern Present (09/23/2023)   Received from [facility] - Occupational Stress Questionnaire    Do you feel  stress - tense, restless, nervous, or anxious, or unable to sleep at night because your mind is troubled all the time - these days?: To some extent  Social Connections: Socially Integrated (09/23/2023)   Received from [facility]   Social Network    How would you rate your social network (family, work, friends)?: Good participation with social networks  Intimate Partner Violence: Not At Risk (09/23/2023)   Received from [facility]   HITS    Over the last 12 months how often did your partner physically hurt you?: Never  Over the last 12 months how often did your partner insult you or talk down to you?: Sometimes    Over the last 12 months how often did your partner threaten you with physical harm?: Never    Over the last 12 months how often did your partner scream or curse at you?: Sometimes    Review of Systems   General: Negative for anorexia, weight loss, fever, chills, fatigue, weakness. Eyes: Negative for vision changes.  ENT: Negative for hoarseness, difficulty swallowing , nasal congestion. CV: Negative for chest pain, angina, palpitations, dyspnea on exertion, peripheral edema.  Respiratory: Negative for dyspnea at rest, dyspnea on exertion, cough, sputum, wheezing.  GI: See history of present illness. GU:  Negative for dysuria, hematuria, urinary incontinence, urinary frequency, nocturnal urination.  MS: Negative for joint pain, low back pain.  Derm: Negative for rash or itching.  Neuro: Negative for weakness, abnormal sensation, seizure, frequent headaches, memory loss,  confusion.  Psych: Negative for anxiety, depression, suicidal ideation, hallucinations.  Endo: Negative for unusual weight change.  Heme: Negative for bruising or bleeding. Allergy: Negative for rash or hives.  Physical Exam   BP 106/69   Pulse 87   Temp 98.4 F (36.9 C)   Ht 5' 9 (1.753 m)   Wt 188 lb 6.4 oz (85.5 kg)   BMI 27.82 kg/m    General: Well-nourished, well-developed in no acute  distress.  Head: Normocephalic, atraumatic.   Eyes: Conjunctiva pink, no icterus. Mouth: Oropharyngeal mucosa moist and pink  Neck: Supple without thyromegaly, masses, or lymphadenopathy.  Lungs: Clear to auscultation bilaterally.  Heart: Regular rate and rhythm, no murmurs rubs or gallops.  Abdomen: Bowel sounds are normal, nontender, nondistended, no hepatosplenomegaly or masses,  no abdominal bruits or hernia, no rebound or guarding.   Rectal: not performed Extremities: No lower extremity edema. No clubbing or deformities.  Neuro: Alert and oriented x 4 , grossly normal neurologically.  Skin: Warm and dry, no rash or jaundice.   Psych: Alert and cooperative, normal mood and affect.  Labs   See above Imaging Studies   No results found.  Assessment/Plan:   Chronic constipation Chronic constipation managed with Linzess and prune juice. Symptoms starting after he took Mounjaro, but discontinued about a year ago. Currently well managed. - Continue Linzess 290 mcg daily  Gastroesophageal reflux disease (GERD) GERD controlled with omeprazole. Discussed Barrett's esophagus risk and consideration of screening. He states his symptoms have not been consistent over he past several years and takings PPI as needed. He has had some ruq pain which he contributes to his constipation. We discuss potential EGD at time of colonoscopy but he was not interested at this time.  -continue omeprazole 20mg  daily as needed.  -reinforced antireflux measures.  Hepatic steatosis Hepatic steatosis noted in 2019 on imaging. Normal LFTs. He has had substantial weight loss, has good control of his DM, very limited etoh use. -FIB 4 of 1.08, advanced fibrosis excluded -Consider liver ultrasound update in future, especially increase in LFTs, or FIB 4 -he will follow with PCP per request   Sonny RAMAN. Ezzard, MHS, PA-C [facility]

## 2023-11-25 NOTE — H&P (View-Only) (Signed)
 GI Office Note    Referring Provider: Suanne Pfeiffer, NP Primary Care Physician:  Suanne Pfeiffer, NP  Primary Gastroenterologist: Ozell Hollingshead, MD   Chief Complaint   Chief Complaint  Patient presents with   New Patient (Initial Visit)    Pt here for ov before colonoscopy. Pt takes laxatives     History of Present Illness   Christopher Hampton is a 53 y.o. male presenting today to schedule first after colonoscopy.    Discussed the use of AI scribe software for clinical note transcription with the patient, who gave verbal consent to proceed.   He experiences constipation and abdominal pain, with difficulty in bowel movements, stating, 'I have a hard time going to the bathroom.' Noticed symptoms really after starting Mounjaro previously. He takes Linzess 290 mcg daily and drinks a 12-ounce cup of prune juice. Without these, he can go two to three days without a bowel movement, whereas he used to have bowel movements three to four times a day. Sharp right sided abdominal pain occurs if he does not have a bowel movement for three to four days.  He used to way 275 pounds and had diabetes. Several years ago he decided to quit smoking and start eating better and was able to drop his weight. He states his A1c consistently below 5.0. He was previously on Mounjaro for diabetes management, which he believes contributed to his constipation. He has been off Mounjaro for over a year and manages his diabetes through diet, with his last A1c recorded at 5.2 in August.   He experiences occasional heartburn and has been on omeprazole for four to five years, which controls his symptoms effectively. He has been intermittent on omeprazole. No swallowing difficulties.  He has a history of a fatty liver noted on a CT scan in 2019, but no recent imaging has been done. He reports minimal alcohol consumption, approximately three beers per month however remotely he would drink more heavily but not  consistently or daily.     Prior Data   09/2023: A1C 5.2, lipase 43, B12 672, cre 0.92, Tbili 0.6, AP 97, AST 15, ALT17 Medications   Current Outpatient Medications  Medication Sig Dispense Refill   aspirin EC 81 MG tablet Take 81 mg by mouth daily.     cholecalciferol (VITAMIN D3) 25 MCG (1000 UNIT) tablet Take 1,000 Units by mouth daily.     hydrochlorothiazide  (HYDRODIURIL ) 12.5 MG tablet Take 12.5 mg by mouth daily.     linaclotide (LINZESS) 290 MCG CAPS capsule Take 290 mcg by mouth daily before breakfast.     lisinopril-hydrochlorothiazide  (PRINZIDE,ZESTORETIC) 10-12.5 MG tablet Take 1 tablet by mouth daily.  0   omeprazole (PRILOSEC) 20 MG capsule Take 20 mg by mouth daily.  3   vitamin B-12 (CYANOCOBALAMIN) 500 MCG tablet Take 500 mcg by mouth daily.     No current facility-administered medications for this visit.    Allergies   Allergies as of 11/25/2023   (No Known Allergies)    Past Medical History   Past Medical History:  Diagnosis Date   Anxiety    Diabetes mellitus without complication (HCC)    GERD (gastroesophageal reflux disease)     Past Surgical History   Past Surgical History:  Procedure Laterality Date   BACK SURGERY     CHOLECYSTECTOMY N/A 02/23/2018   Procedure: LAPAROSCOPIC CHOLECYSTECTOMY;  Surgeon: Kallie Manuelita BROCKS, MD;  Location: [facility];  Service: General;  Laterality: N/A;   HIP SURGERY  Past Family History   Family History  Problem Relation Age of Onset   Epilepsy Father    Colon cancer Neg Hx     Past Social History   Social History   Socioeconomic History   Marital status: Married    Spouse name: Not on file   Number of children: Not on file   Years of education: Not on file   Highest education level: Not on file  Occupational History   Not on file  Tobacco Use   Smoking status: Former    Current packs/day: 0.00    Average packs/day: 1.5 packs/day for 30.0 years (45.0 ttl pk-yrs)    Types: Cigarettes    Start  date: 01/15/1988    Quit date: 01/14/2018    Years since quitting: 5.8   Smokeless tobacco: Current    Types: Chew  Vaping Use   Vaping status: Not on file  Substance and Sexual Activity   Alcohol use: Yes    Alcohol/week: 14.0 standard drinks of alcohol    Types: 14 Cans of beer per week    Comment: daily   Drug use: No   Sexual activity: Yes    Birth control/protection: None  Other Topics Concern   Not on file  Social History Narrative   Not on file   Social Drivers of Health   Financial Resource Strain: Low Risk  (09/23/2023)   Received from [facility]   Overall Financial Resource Strain (CARDIA)    How hard is it for you to pay for the very basics like food, housing, medical care, and heating?: Not hard at all  Food Insecurity: No Food Insecurity (09/23/2023)   Received from [facility]   Hunger Vital Sign    Within the past 12 months, you worried that your food would run out before you got the money to buy more.: Never true    Within the past 12 months, the food you bought just didn't last and you didn't have money to get more.: Never true  Transportation Needs: No Transportation Needs (09/23/2023)   Received from [facility] - Transportation    In the past 12 months, has lack of transportation kept you from medical appointments or from getting medications?: No    In the past 12 months, has lack of transportation kept you from meetings, work, or from getting things needed for daily living?: No  Physical Activity: Sufficiently Active (09/23/2023)   Received from [facility]   Exercise Vital Sign    On average, how many days per week do you engage in moderate to strenuous exercise (like a brisk walk)?: 7 days    On average, how many minutes do you engage in exercise at this level?: 30 min  Stress: Stress Concern Present (09/23/2023)   Received from [facility] - Occupational Stress Questionnaire    Do you feel  stress - tense, restless, nervous, or anxious, or unable to sleep at night because your mind is troubled all the time - these days?: To some extent  Social Connections: Socially Integrated (09/23/2023)   Received from [facility]   Social Network    How would you rate your social network (family, work, friends)?: Good participation with social networks  Intimate Partner Violence: Not At Risk (09/23/2023)   Received from [facility]   HITS    Over the last 12 months how often did your partner physically hurt you?: Never  Over the last 12 months how often did your partner insult you or talk down to you?: Sometimes    Over the last 12 months how often did your partner threaten you with physical harm?: Never    Over the last 12 months how often did your partner scream or curse at you?: Sometimes    Review of Systems   General: Negative for anorexia, weight loss, fever, chills, fatigue, weakness. Eyes: Negative for vision changes.  ENT: Negative for hoarseness, difficulty swallowing , nasal congestion. CV: Negative for chest pain, angina, palpitations, dyspnea on exertion, peripheral edema.  Respiratory: Negative for dyspnea at rest, dyspnea on exertion, cough, sputum, wheezing.  GI: See history of present illness. GU:  Negative for dysuria, hematuria, urinary incontinence, urinary frequency, nocturnal urination.  MS: Negative for joint pain, low back pain.  Derm: Negative for rash or itching.  Neuro: Negative for weakness, abnormal sensation, seizure, frequent headaches, memory loss,  confusion.  Psych: Negative for anxiety, depression, suicidal ideation, hallucinations.  Endo: Negative for unusual weight change.  Heme: Negative for bruising or bleeding. Allergy: Negative for rash or hives.  Physical Exam   BP 106/69   Pulse 87   Temp 98.4 F (36.9 C)   Ht 5' 9 (1.753 m)   Wt 188 lb 6.4 oz (85.5 kg)   BMI 27.82 kg/m    General: Well-nourished, well-developed in no acute  distress.  Head: Normocephalic, atraumatic.   Eyes: Conjunctiva pink, no icterus. Mouth: Oropharyngeal mucosa moist and pink  Neck: Supple without thyromegaly, masses, or lymphadenopathy.  Lungs: Clear to auscultation bilaterally.  Heart: Regular rate and rhythm, no murmurs rubs or gallops.  Abdomen: Bowel sounds are normal, nontender, nondistended, no hepatosplenomegaly or masses,  no abdominal bruits or hernia, no rebound or guarding.   Rectal: not performed Extremities: No lower extremity edema. No clubbing or deformities.  Neuro: Alert and oriented x 4 , grossly normal neurologically.  Skin: Warm and dry, no rash or jaundice.   Psych: Alert and cooperative, normal mood and affect.  Labs   See above Imaging Studies   No results found.  Assessment/Plan:   Chronic constipation Chronic constipation managed with Linzess and prune juice. Symptoms starting after he took Mounjaro, but discontinued about a year ago. Currently well managed. - Continue Linzess 290 mcg daily  Gastroesophageal reflux disease (GERD) GERD controlled with omeprazole. Discussed Barrett's esophagus risk and consideration of screening. He states his symptoms have not been consistent over he past several years and takings PPI as needed. He has had some ruq pain which he contributes to his constipation. We discuss potential EGD at time of colonoscopy but he was not interested at this time.  -continue omeprazole 20mg  daily as needed.  -reinforced antireflux measures.  Hepatic steatosis Hepatic steatosis noted in 2019 on imaging. Normal LFTs. He has had substantial weight loss, has good control of his DM, very limited etoh use. -FIB 4 of 1.08, advanced fibrosis excluded -Consider liver ultrasound update in future, especially increase in LFTs, or FIB 4 -he will follow with PCP per request   Sonny RAMAN. Ezzard, MHS, PA-C [facility]

## 2023-11-25 NOTE — Patient Instructions (Signed)
 Continue Linzess daily.  Colonoscopy to be scheduled. See separate instructions.

## 2023-11-26 ENCOUNTER — Telehealth: Payer: Self-pay | Admitting: *Deleted

## 2023-11-26 NOTE — Telephone Encounter (Signed)
 Spoke with [facility]. Was advised no PA required for procedure. Call ref# [id number]

## 2023-12-15 ENCOUNTER — Encounter: Payer: Self-pay | Admitting: *Deleted

## 2023-12-18 ENCOUNTER — Ambulatory Visit (HOSPITAL_COMMUNITY): Payer: PRIVATE HEALTH INSURANCE | Admitting: Anesthesiology

## 2023-12-18 ENCOUNTER — Other Ambulatory Visit: Payer: Self-pay

## 2023-12-18 ENCOUNTER — Encounter (HOSPITAL_COMMUNITY)
Admission: RE | Disposition: A | Discharge status: Home / Self Care | Payer: Self-pay | Source: Home / Self Care | Attending: Internal Medicine

## 2023-12-18 ENCOUNTER — Encounter (HOSPITAL_COMMUNITY): Payer: Self-pay | Admitting: Internal Medicine

## 2023-12-18 ENCOUNTER — Ambulatory Visit (HOSPITAL_COMMUNITY)
Admission: RE | Admit: 2023-12-18 | Discharge: 2023-12-18 | Disposition: A | Discharge status: Home / Self Care | Payer: PRIVATE HEALTH INSURANCE | Attending: Internal Medicine | Admitting: Internal Medicine

## 2023-12-18 DIAGNOSIS — Z87891 Personal history of nicotine dependence: Secondary | ICD-10-CM | POA: Diagnosis not present

## 2023-12-18 DIAGNOSIS — Z1211 Encounter for screening for malignant neoplasm of colon: Secondary | ICD-10-CM | POA: Insufficient documentation

## 2023-12-18 DIAGNOSIS — E119 Type 2 diabetes mellitus without complications: Secondary | ICD-10-CM | POA: Insufficient documentation

## 2023-12-18 DIAGNOSIS — Z7982 Long term (current) use of aspirin: Secondary | ICD-10-CM | POA: Insufficient documentation

## 2023-12-18 DIAGNOSIS — K621 Rectal polyp: Secondary | ICD-10-CM | POA: Diagnosis not present

## 2023-12-18 DIAGNOSIS — F17220 Nicotine dependence, chewing tobacco, uncomplicated: Secondary | ICD-10-CM | POA: Insufficient documentation

## 2023-12-18 DIAGNOSIS — F419 Anxiety disorder, unspecified: Secondary | ICD-10-CM | POA: Diagnosis not present

## 2023-12-18 DIAGNOSIS — Z79899 Other long term (current) drug therapy: Secondary | ICD-10-CM | POA: Diagnosis not present

## 2023-12-18 DIAGNOSIS — K219 Gastro-esophageal reflux disease without esophagitis: Secondary | ICD-10-CM | POA: Insufficient documentation

## 2023-12-18 DIAGNOSIS — D122 Benign neoplasm of ascending colon: Secondary | ICD-10-CM | POA: Insufficient documentation

## 2023-12-18 DIAGNOSIS — K635 Polyp of colon: Secondary | ICD-10-CM

## 2023-12-18 DIAGNOSIS — I10 Essential (primary) hypertension: Secondary | ICD-10-CM

## 2023-12-18 HISTORY — PX: COLONOSCOPY: SHX5424

## 2023-12-18 HISTORY — DX: Essential (primary) hypertension: I10

## 2023-12-18 LAB — GLUCOSE, CAPILLARY: Glucose-Capillary: 96 mg/dL (ref 70–99)

## 2023-12-18 SURGERY — COLONOSCOPY
Anesthesia: General

## 2023-12-18 MED ORDER — PROPOFOL 500 MG/50ML IV EMUL
INTRAVENOUS | Status: DC | PRN
  Administered 2023-12-18: 200 ug/kg/min via INTRAVENOUS

## 2023-12-18 MED ORDER — LIDOCAINE 2% (20 MG/ML) 5 ML SYRINGE
INTRAMUSCULAR | Status: DC | PRN
  Administered 2023-12-18: 100 mg via INTRAVENOUS

## 2023-12-18 MED ORDER — PROPOFOL 10 MG/ML IV BOLUS
INTRAVENOUS | Status: DC | PRN
  Administered 2023-12-18: 100 mg via INTRAVENOUS

## 2023-12-18 MED ORDER — LACTATED RINGERS IV SOLN: INTRAVENOUS | Status: DC | PRN

## 2023-12-18 MED ORDER — LACTATED RINGERS IV SOLN: INTRAVENOUS | Status: DC

## 2023-12-18 MED ORDER — STERILE WATER FOR IRRIGATION IR SOLN
Status: DC | PRN
  Administered 2023-12-18: 120 mL

## 2023-12-18 NOTE — Op Note (Signed)
 [facility] Patient Name: Christopher Hampton Procedure Date: 12/18/2023 9:38 AM MRN: [id number] Date of Birth: 1970/11/22 Attending MD: Lamar Ozell Hollingshead , MD, [id number] CSN: [id number] Age: 53 Admit Type: Outpatient Procedure:                Colonoscopy Indications:              Screening for colorectal malignant neoplasm Providers:                Lamar Ozell Hollingshead, MD, Leandrew Edelman RN, RN, Bascom Blush Referring MD:              Medicines:                Propofol  per Anesthesia Complications:            No immediate complications. Estimated Blood Loss:     Estimated blood loss was minimal. Procedure:                Pre-Anesthesia Assessment:                           - Prior to the procedure, a History and Physical                            was performed, and patient medications and                            allergies were reviewed. The patient's tolerance of                            previous anesthesia was also reviewed. The risks                            and benefits of the procedure and the sedation                            options and risks were discussed with the patient.                            All questions were answered, and informed consent                            was obtained. Prior Anticoagulants: The patient has                            taken no anticoagulant or antiplatelet agents. ASA                            Grade Assessment: II - A patient with mild systemic                            disease. After reviewing the risks and benefits,  the patient was deemed in satisfactory condition to                            undergo the procedure.                           After obtaining informed consent, the colonoscope                            was passed under direct vision. Throughout the                            procedure, the patient's blood pressure, pulse, and                             oxygen saturations were monitored continuously. The                            [id number] ([id number]) Colon was introduced through                            the anus and advanced to the the cecum, identified                            by appendiceal orifice and ileocecal valve. The                            colonoscopy was performed without difficulty. The                            patient tolerated the procedure well. The quality                            of the bowel preparation was adequate. The                            ileocecal valve, appendiceal orifice, and rectum                            were photographed. Scope In: 9:59:40 AM Scope Out: 10:14:48 AM Scope Withdrawal Time: 0 hours 11 minutes 49 seconds  Total Procedure Duration: 0 hours 15 minutes 8 seconds  Findings:      The perianal and digital rectal examinations were normal.      A 6 mm polyp was found in the ascending colon. The polyp was sessile.       The polyp was removed with a cold snare. Resection and retrieval were       complete. Estimated blood loss was minimal.      A 2 mm polyp was found in the rectum. The polyp was sessile. The polyp       was removed with a cold biopsy forceps. Resection and retrieval were       complete. Estimated blood loss was minimal.      The exam was otherwise without abnormality on direct and retroflexion       views.  Impression:               - One 6 mm polyp in the ascending colon, removed                            with a cold snare. Resected and retrieved.                           - One polyp in the rectum, removed with a cold                            biopsy forceps. Resected and retrieved.                           - The examination was otherwise normal on direct                            and retroflexion views. Moderate Sedation:      Moderate (conscious) sedation was personally administered by an       anesthesia professional. The following parameters were monitored:  oxygen       saturation, heart rate, blood pressure, respiratory rate, EKG, adequacy       of pulmonary ventilation, and response to care. Recommendation:           - Patient has a contact number available for                            emergencies. The signs and symptoms of potential                            delayed complications were discussed with the                            patient. Return to normal activities tomorrow.                            Written discharge instructions were provided to the                            patient.                           - Advance diet as tolerated.                           - Continue present medications.                           - Repeat colonoscopy date to be determined after                            pending pathology results are reviewed for                            surveillance.                           -  Return to GI office after studies are complete. Procedure Code(s):        --- Professional ---                           [phone], Colonoscopy, flexible; with removal of                            tumor(s), polyp(s), or other lesion(s) by snare                            technique                           45380, 59, Colonoscopy, flexible; with biopsy,                            single or multiple Diagnosis Code(s):        --- Professional ---                           Z12.11, Encounter for screening for malignant                            neoplasm of colon                           D12.2, Benign neoplasm of ascending colon                           D12.8, Benign neoplasm of rectum CPT copyright 2022 [employer]. All rights reserved. The codes documented in this report are preliminary and upon coder review may  be revised to meet current compliance requirements. Lamar HERO. Shakeeta Godette, MD Lamar Ozell Hollingshead, MD 12/18/2023 10:21:29 AM This report has been signed electronically. Number of Addenda: 0

## 2023-12-18 NOTE — Transfer of Care (Signed)
 Immediate Anesthesia Transfer of Care Note  Patient: Christopher Hampton  Procedure(s) Performed: COLONOSCOPY  Patient Location: Endoscopy Unit  Anesthesia Type:General  Level of Consciousness: awake, alert , oriented, and patient cooperative  Airway & Oxygen Therapy: Patient Spontanous Breathing  Post-op Assessment: Report given to RN, Post -op Vital signs reviewed and stable, and Patient moving all extremities X 4  Post vital signs: Reviewed and stable  Last Vitals:  Vitals Value Taken Time  BP 79/55 12/18/23 10:22  Temp 36.6 C 12/18/23 10:17  Pulse 80 12/18/23 10:22  Resp 13 12/18/23 10:22  SpO2 98 % 12/18/23 10:22    Last Pain:  Vitals:   12/18/23 1017  TempSrc: Oral  PainSc: 0-No pain      Patients Stated Pain Goal: 10 (12/18/23 0832)  Complications: No notable events documented.

## 2023-12-18 NOTE — Anesthesia Preprocedure Evaluation (Signed)
 Anesthesia Evaluation  Patient identified by MRN, date of birth, ID band Patient awake    Reviewed: Allergy & Precautions, H&P , NPO status , Patient's Chart, lab work & pertinent test results, reviewed documented beta blocker date and time   Airway Mallampati: II  TM Distance: >3 FB Neck ROM: full    Dental no notable dental hx.    Pulmonary neg pulmonary ROS, former smoker   Pulmonary exam normal breath sounds clear to auscultation       Cardiovascular Exercise Tolerance: Good hypertension,  Rhythm:regular Rate:Normal     Neuro/Psych   Anxiety     negative neurological ROS  negative psych ROS   GI/Hepatic Neg liver ROS,GERD  ,,  Endo/Other  diabetes    Renal/GU negative Renal ROS  negative genitourinary   Musculoskeletal   Abdominal   Peds  Hematology negative hematology ROS (+)   Anesthesia Other Findings   Reproductive/Obstetrics negative OB ROS                              Anesthesia Physical Anesthesia Plan  ASA: 2  Anesthesia Plan: General   Post-op Pain Management:    Induction:   PONV Risk Score and Plan: Propofol  infusion  Airway Management Planned:   Additional Equipment:   Intra-op Plan:   Post-operative Plan:   Informed Consent: I have reviewed the patients History and Physical, chart, labs and discussed the procedure including the risks, benefits and alternatives for the proposed anesthesia with the patient or authorized representative who has indicated his/her understanding and acceptance.     Dental Advisory Given  Plan Discussed with: CRNA  Anesthesia Plan Comments:         Anesthesia Quick Evaluation

## 2023-12-18 NOTE — Interval H&P Note (Signed)
 History and Physical Interval Note:  12/18/2023 9:45 AM  [name]  has presented today for surgery, with the diagnosis of SCREENING.  The various methods of treatment have been discussed with the patient and family. After consideration of risks, benefits and other options for treatment, the patient has consented to  Procedure(s) with comments: COLONOSCOPY (N/A) - 945am, asa 2 as a surgical intervention.  The patient's history has been reviewed, patient examined, no change in status, stable for surgery.  I have reviewed the patient's chart and labs.  Questions were answered to the patient's satisfaction.        Patient here for first-ever screening colonoscopy.  No change.  The risks, benefits, limitations, alternatives and imponderables have been reviewed with the patient. Questions have been answered. All parties are agreeable.     Christopher Hampton

## 2023-12-18 NOTE — Discharge Instructions (Signed)
  Colonoscopy Discharge Instructions  Read the instructions outlined below and refer to this sheet in the next few weeks. These discharge instructions provide you with general information on caring for yourself after you leave the hospital. Your doctor may also give you specific instructions. While your treatment has been planned according to the most current medical practices available, unavoidable complications occasionally occur. If you have any problems or questions after discharge, call Dr. Riley Cheadle at [phone]. ACTIVITY You may resume your regular activity, but move at a slower pace for the next 24 hours.  Take frequent rest periods for the next 24 hours.  Walking will help get rid of the air and reduce the bloated feeling in your belly (abdomen).  No driving for 24 hours (because of the medicine (anesthesia) used during the test).   Do not sign any important legal documents or operate any machinery for 24 hours (because of the anesthesia used during the test).  NUTRITION Drink plenty of fluids.  You may resume your normal diet as instructed by your doctor.  Begin with a light meal and progress to your normal diet. Heavy or fried foods are harder to digest and may make you feel sick to your stomach (nauseated).  Avoid alcoholic beverages for 24 hours or as instructed.  MEDICATIONS You may resume your normal medications unless your doctor tells you otherwise.  WHAT YOU CAN EXPECT TODAY Some feelings of bloating in the abdomen.  Passage of more gas than usual.  Spotting of blood in your stool or on the toilet paper.  IF YOU HAD POLYPS REMOVED DURING THE COLONOSCOPY: No aspirin products for 7 days or as instructed.  No alcohol for 7 days or as instructed.  Eat a soft diet for the next 24 hours.  FINDING OUT THE RESULTS OF YOUR TEST Not all test results are available during your visit. If your test results are not back during the visit, make an appointment with your caregiver to find out the  results. Do not assume everything is normal if you have not heard from your caregiver or the medical facility. It is important for you to follow up on all of your test results.  SEEK IMMEDIATE MEDICAL ATTENTION IF: You have more than a spotting of blood in your stool.  Your belly is swollen (abdominal distention).  You are nauseated or vomiting.  You have a temperature over 101.  You have abdominal pain or discomfort that is severe or gets worse throughout the day.    2 small polyps found and removed  Further recommendations to follow pending review of pathology report

## 2023-12-19 ENCOUNTER — Encounter (HOSPITAL_COMMUNITY): Payer: Self-pay | Admitting: Internal Medicine

## 2023-12-19 LAB — SURGICAL PATHOLOGY

## 2023-12-19 NOTE — Anesthesia Postprocedure Evaluation (Signed)
 Anesthesia Post Note  Patient: Christopher Hampton  Procedure(s) Performed: COLONOSCOPY  Patient location during evaluation: Phase II Anesthesia Type: General Level of consciousness: awake Pain management: pain level controlled Vital Signs Assessment: post-procedure vital signs reviewed and stable Respiratory status: spontaneous breathing and respiratory function stable Cardiovascular status: blood pressure returned to baseline and stable Postop Assessment: no headache and no apparent nausea or vomiting Anesthetic complications: no Comments: Late entry   No notable events documented.   Last Vitals:  Vitals:   12/18/23 1026 12/18/23 1028  BP: (!) 81/60 93/68  Pulse: 82   Resp: 16 (!) 81  Temp:    SpO2: 98% 99%    Last Pain:  Vitals:   12/18/23 1017  TempSrc: Oral  PainSc: 0-No pain                 Yvonna JINNY Bosworth

## 2023-12-21 ENCOUNTER — Ambulatory Visit: Payer: Self-pay | Admitting: Internal Medicine
